# Patient Record
Sex: Male | Born: 1985 | Race: Black or African American | Hispanic: No | Marital: Single | Smoking: Current every day smoker
Health system: Southern US, Community
[De-identification: ages and names within clinical notes are randomized; demographics above are authoritative.]

## PROBLEM LIST (undated history)

## (undated) DIAGNOSIS — I10 Essential (primary) hypertension: Secondary | ICD-10-CM

---
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
## 2008-08-11 ENCOUNTER — Emergency Department (HOSPITAL_COMMUNITY): Admission: EM | Admit: 2008-08-11 | Discharge: 2008-08-12 | Payer: Self-pay | Admitting: Emergency Medicine

## 2012-07-23 ENCOUNTER — Emergency Department (HOSPITAL_COMMUNITY)
Admission: EM | Admit: 2012-07-23 | Discharge: 2012-07-23 | Disposition: A | Discharge status: Home / Self Care | Payer: PRIVATE HEALTH INSURANCE | Attending: Emergency Medicine | Admitting: Emergency Medicine

## 2012-07-23 ENCOUNTER — Encounter (HOSPITAL_COMMUNITY): Payer: Self-pay | Admitting: Emergency Medicine

## 2012-07-23 DIAGNOSIS — J029 Acute pharyngitis, unspecified: Secondary | ICD-10-CM

## 2012-07-23 DIAGNOSIS — F17200 Nicotine dependence, unspecified, uncomplicated: Secondary | ICD-10-CM | POA: Insufficient documentation

## 2012-07-23 LAB — URINALYSIS, ROUTINE W REFLEX MICROSCOPIC
Bilirubin Urine: NEGATIVE
Glucose, UA: NEGATIVE mg/dL
Ketones, ur: NEGATIVE mg/dL
Leukocytes, UA: NEGATIVE
Protein, ur: NEGATIVE mg/dL
pH: 6.5 (ref 5.0–8.0)

## 2012-07-23 LAB — RAPID STREP SCREEN (MED CTR MEBANE ONLY): Streptococcus, Group A Screen (Direct): NEGATIVE

## 2012-07-23 NOTE — ED Provider Notes (Signed)
Medical screening examination/treatment/procedure(s) were performed by non-physician practitioner and as supervising physician I was immediately available for consultation/collaboration.   Candice Lunney L Milik Gilreath, MD 07/23/12 2251 

## 2012-07-23 NOTE — ED Notes (Signed)
Pt alert, arrives from home, c/o sore thraot, onset months ago, states given Amoxicillin, throat felt better, returns this evening with cont discomfort, resp even unlabored, skin pwd, no stridor noted

## 2012-07-23 NOTE — ED Provider Notes (Signed)
History     CSN: [id number]  Arrival date & time 07/23/12  0340   First MD Initiated Contact with Patient 07/23/12 0401      Chief Complaint  Patient presents with  . Sore Throat   HPI  History provided by the patient. Patient is a 26 year old male with no significant PMH who presents with complaints of sore throat for the past 3 days. Patient reports having similar sore throat symptoms to 3 months ago. States she was treated with amoxicillin that time and had improvement. Symptoms began acutely. Patient has used ibuprofen at home for pain with some improvement. He denies having any other associated symptoms. Denies any nasal congestion, rhinorrhea, fever, chills, sweats, nausea or vomiting.    History reviewed. No pertinent past medical history.  History reviewed. No pertinent past surgical history.  No family history on file.  History  Substance Use Topics  . Smoking status: Current Everyday Smoker -- 1.0 packs/day    Types: Cigarettes  . Smokeless tobacco: Not on file  . Alcohol Use: No      Review of Systems  Constitutional: Negative for fever and chills.  HENT: Positive for sore throat. Negative for congestion and rhinorrhea.   Respiratory: Negative for cough.   Gastrointestinal: Negative for nausea and vomiting.  Genitourinary: Positive for flank pain. Negative for dysuria, frequency and hematuria.    Allergies  Review of patient's allergies indicates no known allergies.  Home Medications   Current Outpatient Rx  Name Route Sig Dispense Refill  . IBUPROFEN 200 MG PO TABS Oral Take 600 mg by mouth every 6 (six) hours as needed. Pain      BP 147/86  Pulse 83  Temp 98 F (36.7 C) (Oral)  Resp 16  Wt 175 lb (79.379 kg)  SpO2 99%  Physical Exam  Nursing note and vitals reviewed. Constitutional: He is oriented to person, place, and time. He appears well-developed and well-nourished.  HENT:  Head: Normocephalic.  Mouth/Throat: Oropharynx is clear and  moist.       Mild erythema of pharynx. No exudates. Tonsils normal size. Uvula midline.  Neck: Normal range of motion. Neck supple.  Cardiovascular: Normal rate and regular rhythm.   Pulmonary/Chest: Effort normal and breath sounds normal.  Abdominal: Soft.  Lymphadenopathy:    He has no cervical adenopathy.  Neurological: He is alert and oriented to person, place, and time.  Skin: Skin is warm.  Psychiatric: He has a normal mood and affect.    ED Course  Procedures   Results for orders placed during the hospital encounter of 07/23/12  RAPID STREP SCREEN      Component Value Range   Streptococcus, Group A Screen (Direct) NEGATIVE  NEGATIVE  URINALYSIS, ROUTINE W REFLEX MICROSCOPIC      Component Value Range   Color, Urine YELLOW  YELLOW   APPearance CLEAR  CLEAR   Specific Gravity, Urine 1.010  1.005 - 1.030   pH 6.5  5.0 - 8.0   Glucose, UA NEGATIVE  NEGATIVE mg/dL   Hgb urine dipstick NEGATIVE  NEGATIVE   Bilirubin Urine NEGATIVE  NEGATIVE   Ketones, ur NEGATIVE  NEGATIVE mg/dL   Protein, ur NEGATIVE  NEGATIVE mg/dL   Urobilinogen, UA 0.2  0.0 - 1.0 mg/dL   Nitrite NEGATIVE  NEGATIVE   Leukocytes, UA NEGATIVE  NEGATIVE       1. Pharyngitis       MDM  Patient seen and evaluated. Patient is afebrile. Pharynx mildly erythematous. No  significant swelling of tonsils or exudate. Patient also has some complaints of urinary problems. No penile discharge.   Strep throat negative, UA also unremarkable. This time suspect viral process. We'll discharge home.         Angus Seller, PA 07/23/12 0600

## 2012-07-23 NOTE — ED Notes (Signed)
Pt st's he's had a sore throat for 3 months, st's he took his father's amoxicillin 3 months ago but only took 2 pills.  St's his throat started feeling better at that point so he didn't take anymore.  No runny nose, no n/v/d.  Resp even and unlabored.

## 2015-12-04 ENCOUNTER — Encounter (HOSPITAL_COMMUNITY): Payer: Self-pay | Admitting: Emergency Medicine

## 2015-12-04 ENCOUNTER — Emergency Department (HOSPITAL_COMMUNITY)
Admission: EM | Admit: 2015-12-04 | Discharge: 2015-12-04 | Payer: Self-pay | Attending: Emergency Medicine | Admitting: Emergency Medicine

## 2015-12-04 DIAGNOSIS — E860 Dehydration: Secondary | ICD-10-CM | POA: Insufficient documentation

## 2015-12-04 DIAGNOSIS — F1210 Cannabis abuse, uncomplicated: Secondary | ICD-10-CM | POA: Insufficient documentation

## 2015-12-04 DIAGNOSIS — Z79899 Other long term (current) drug therapy: Secondary | ICD-10-CM | POA: Insufficient documentation

## 2015-12-04 DIAGNOSIS — I10 Essential (primary) hypertension: Secondary | ICD-10-CM | POA: Insufficient documentation

## 2015-12-04 DIAGNOSIS — F1410 Cocaine abuse, uncomplicated: Secondary | ICD-10-CM | POA: Insufficient documentation

## 2015-12-04 DIAGNOSIS — R51 Headache: Secondary | ICD-10-CM | POA: Insufficient documentation

## 2015-12-04 HISTORY — DX: Essential (primary) hypertension: I10

## 2015-12-04 LAB — URINALYSIS, ROUTINE W REFLEX MICROSCOPIC
Glucose, UA: NEGATIVE mg/dL
HGB URINE DIPSTICK: NEGATIVE
Ketones, ur: 15 mg/dL — AB
Nitrite: NEGATIVE
PH: 6 (ref 5.0–8.0)
Protein, ur: 100 mg/dL — AB
SPECIFIC GRAVITY, URINE: 1.035 — AB (ref 1.005–1.030)

## 2015-12-04 LAB — URINE MICROSCOPIC-ADD ON: SQUAMOUS EPITHELIAL / LPF: NONE SEEN

## 2015-12-04 LAB — CBC WITH DIFFERENTIAL/PLATELET
Basophils Absolute: 0 10*3/uL (ref 0.0–0.1)
Basophils Relative: 0 %
EOS PCT: 0 %
Eosinophils Absolute: 0 10*3/uL (ref 0.0–0.7)
HCT: 42.1 % (ref 39.0–52.0)
HEMOGLOBIN: 14.4 g/dL (ref 13.0–17.0)
LYMPHS ABS: 2.4 10*3/uL (ref 0.7–4.0)
LYMPHS PCT: 18 %
MCH: 27.4 pg (ref 26.0–34.0)
MCHC: 34.2 g/dL (ref 30.0–36.0)
MCV: 80 fL (ref 78.0–100.0)
MONOS PCT: 6 %
Monocytes Absolute: 0.8 10*3/uL (ref 0.1–1.0)
Neutro Abs: 10 10*3/uL — ABNORMAL HIGH (ref 1.7–7.7)
Neutrophils Relative %: 76 %
PLATELETS: 255 10*3/uL (ref 150–400)
RBC: 5.26 MIL/uL (ref 4.22–5.81)
RDW: 13.1 % (ref 11.5–15.5)
WBC: 13.1 10*3/uL — AB (ref 4.0–10.5)

## 2015-12-04 LAB — COMPREHENSIVE METABOLIC PANEL
ALK PHOS: 48 U/L (ref 38–126)
ALT: 18 U/L (ref 17–63)
AST: 25 U/L (ref 15–41)
Albumin: 4.9 g/dL (ref 3.5–5.0)
Anion gap: 12 (ref 5–15)
BUN: 20 mg/dL (ref 6–20)
CALCIUM: 9.6 mg/dL (ref 8.9–10.3)
CO2: 24 mmol/L (ref 22–32)
CREATININE: 1.89 mg/dL — AB (ref 0.61–1.24)
Chloride: 100 mmol/L — ABNORMAL LOW (ref 101–111)
GFR, EST AFRICAN AMERICAN: 54 mL/min — AB (ref >60)
GFR, EST NON AFRICAN AMERICAN: 47 mL/min — AB (ref >60)
Glucose, Bld: 143 mg/dL — ABNORMAL HIGH (ref 65–99)
Potassium: 3.7 mmol/L (ref 3.5–5.1)
Sodium: 136 mmol/L (ref 135–145)
Total Bilirubin: 2 mg/dL — ABNORMAL HIGH (ref 0.3–1.2)
Total Protein: 8 g/dL (ref 6.5–8.1)

## 2015-12-04 LAB — RAPID URINE DRUG SCREEN, HOSP PERFORMED
AMPHETAMINES: NOT DETECTED
BENZODIAZEPINES: NOT DETECTED
Barbiturates: NOT DETECTED
Cocaine: POSITIVE — AB
OPIATES: NOT DETECTED
Tetrahydrocannabinol: POSITIVE — AB

## 2015-12-04 MED ORDER — SODIUM CHLORIDE 0.9 % IV BOLUS (SEPSIS)
1000.0000 mL | Freq: Once | INTRAVENOUS | Status: AC
  Administered 2015-12-04: 1000 mL via INTRAVENOUS

## 2015-12-04 NOTE — Discharge Instructions (Signed)
Polysubstance Abuse When people abuse more than one drug or type of drug it is called polysubstance or polydrug abuse. For example, many smokers also drink alcohol. This is one form of polydrug abuse. Polydrug abuse also refers to the use of a drug to counteract an unpleasant effect produced by another drug. It may also be used to help with withdrawal from another drug. People who take stimulants may become agitated. Sometimes this agitation is countered with a tranquilizer. This helps protect against the unpleasant side effects. Polydrug abuse also refers to the use of different drugs at the same time.  Anytime drug use is interfering with normal living activities, it has become abuse. This includes problems with family and friends. Psychological dependence has developed when your mind tells you that the drug is needed. This is usually followed by physical dependence which has developed when continuing increases of drug are required to get the same feeling or "high". This is known as addiction or chemical dependency. A person's risk is much higher if there is a history of chemical dependency in the family. SIGNS OF CHEMICAL DEPENDENCY  You have been told by friends or family that drugs have become a problem.  You fight when using drugs.  You are having blackouts (not remembering what you do while using).  You feel sick from using drugs but continue using.  You lie about use or amounts of drugs (chemicals) used.  You need chemicals to get you going.  You are suffering in work performance or in school because of drug use.  You get sick from use of drugs but continue to use anyway.  You need drugs to relate to people or feel comfortable in social situations.  You use drugs to forget problems. "Yes" answered to any of the above signs of chemical dependency indicates there are problems. The longer the use of drugs continues, the greater the problems will become. If there is a family history of  drug or alcohol use, it is best not to experiment with these drugs. Continual use leads to tolerance. After tolerance develops more of the drug is needed to get the same feeling. This is followed by addiction. With addiction, drugs become the most important part of life. It becomes more important to take drugs than participate in the other usual activities of life. This includes relating to friends and family. Addiction is followed by dependency. Dependency is a condition where drugs are now needed not just to get high, but to feel normal. Addiction cannot be cured but it can be stopped. This often requires outside help and the care of professionals. Treatment centers are listed in the yellow pages under: Cocaine, Narcotics, and Alcoholics Anonymous. Most hospitals and clinics can refer you to a specialized care center. Talk to your caregiver if you need help.   This information is not intended to replace advice given to you by your health care provider. Make sure you discuss any questions you have with your health care provider.   Document Released: 08/07/2005 Document Revised: 03/09/2012 Document Reviewed: 12/21/2014 Elsevier Interactive Patient Education 2016 Elsevier Inc.  Dehydration, Adult Dehydration means your body does not have as much fluid or water as it needs. It happens when you take in less fluid than you lose. Your kidneys, brain, and heart will not work properly without the right amount of fluids.  Dehydration can range from mild to severe. It should be treated right away to help prevent it from becoming severe. HOME CARE  Drink  enough fluid to keep your pee (urine) clear or pale yellow.  Drink water or fluid slowly by taking small sips. You can also try sucking on ice cubes.  Have food or drinks that contain electrolytes. Examples include bananas and sports drinks.  Take over-the-counter and prescription medicines only as told by your doctor.  Prepare oral rehydration solution  (ORS) according to the instructions that came with it. Take sips of ORS every 5 minutes until your pee returns to normal.  If you are throwing up (vomiting) or have watery poop (diarrhea), keep trying to drink water, ORS, or both.  If you have watery poop, avoid:  Drinks with caffeine.  Fruit juice.  Milk.  Carbonated soft drinks.  Do not take salt tablets. This can lead to having too much sodium in your body (hypernatremia). GET HELP IF:  You cannot eat or drink without throwing up.  You have had mild watery poop for longer than 24 hours.  You have a fever. GET HELP RIGHT AWAY IF:   You have very strong thirst.  You have very bad watery poop.  You have not peed in 6-8 hours, or you have peed only a small amount of very dark pee.  You have shriveled skin.  You are dizzy, confused, or both.   This information is not intended to replace advice given to you by your health care provider. Make sure you discuss any questions you have with your health care provider.   Document Released: 10/12/2009 Document Revised: 09/06/2015 Document Reviewed: 05/03/2015 Elsevier Interactive Patient Education 2016 [employer].  Emergency Department Resource Guide 1) Find a Doctor and Pay Out of Pocket Although you won't have to find out who is covered by your insurance plan, it is a good idea to ask around and get recommendations. You will then need to call the office and see if the doctor you have chosen will accept you as a new patient and what types of options they offer for patients who are self-pay. Some doctors offer discounts or will set up payment plans for their patients who do not have insurance, but you will need to ask so you aren't surprised when you get to your appointment.  2) Contact Your Local Health Department Not all health departments have doctors that can see patients for sick visits, but many do, so it is worth a call to see if yours does. If you don't know where your  local health department is, you can check in your phone book. The CDC also has a tool to help you locate your state's health department, and many state websites also have listings of all of their local health departments.  3) Find a Walk-in Clinic If your illness is not likely to be very severe or complicated, you may want to try a walk in clinic. These are popping up all over the country in pharmacies, drugstores, and shopping centers. They're usually staffed by nurse practitioners or physician assistants that have been trained to treat common illnesses and complaints. They're usually fairly quick and inexpensive. However, if you have serious medical issues or chronic medical problems, these are probably not your best option.  No Primary Care Doctor: - Call Health Connect at  [phone] - they can help you locate a primary care doctor that  accepts your insurance, provides certain services, etc. - Physician Referral Service- [phone]  Chronic Pain Problems: Organization         Address  Phone   Notes  [facility]  [phone] Patients need to be referred by their primary care doctor.   Medication Assistance: Organization         Address  Phone   Notes  [facility] Medication [facility] [address] [place], [place] [address] [phone] --Must be a resident of [facility] -- Must have NO insurance coverage whatsoever (no Medicaid/ Medicare, etc.) -- The pt. MUST have a primary care doctor that directs their care regularly and follows them in the community   [facility]  [phone]   [employer]  [phone]    Agencies that provide inexpensive medical care: Organization         Address  Phone   Notes  [facility]  [phone]   [facility]    [phone]   [facility] [address] [place], [place] [address] [phone]   [facility] [address] [place]  [address] [place] [phone]   Planned Parenthood    [phone]   [facility]    [phone]   [facility]  [address], [place] Phone:  [phone], Fax:  [phone] Hours of Operation:  9 am - 6 pm, M-F.  Also accepts Medicaid/Medicare and self-pay.  [facility]  [address], [place] Phone: [phone], Fax: [phone]. Hours of Operation:  8:30 am - 5:30 pm, M-F.  Also accepts Medicaid and self-pay.  [facility] [place] [address], [place] Phone: [phone]   [facility] [address] Natasha Bence [place], [place] [phone], Ext. [phone] Mondays & Thursdays: 7-9 AM.  First 15 patients are seen on a first come, first serve basis.    Medicaid-accepting [facility] Providers:  Organization         Address  Phone   Notes  [facility] [address],  [phone] Also accepts self-pay patients.  [facility] [address] Laurell Josephs [place], [place]  [phone]   [facility] [address], [place] [phone]   [facility] [address] [place] [phone]   Renaye Rakers [address], [place]   [phone] Only accepts [place] Access [place] patients after they have their name applied to their card.   Self-Pay (no insurance) in [facility]:  Organization         Address  Phone   Notes  Sickle Cell Patients, [facility] Internal Medicine [address] [place], [place] [phone]   [facility] [address] [place], [place] [phone]   [facility] [place]  [address], [place] [phone]   [facility]/Dr. Osei-Bonsu  [address] [place] or [address], [place] [phone] Phone number for both [place] and [place] locations is the same.  [facility] [address], [place] [phone]   [facility] [address], [place] or [address] [phone]   [facility] [address], [place] [phone], phone; [phone], fax Sees patients 1st and 3rd Saturday of every month.  Must not qualify for public or private insurance (i.e. Medicaid, Medicare, [place]  Health Choice, Veterans' Benefits)  Household income should be no more than 200% of the poverty level The clinic cannot treat you if you are pregnant or think you are pregnant  Sexually transmitted diseases are not treated at the clinic.    Dental Care: Organization         Address  Phone  Notes  [facility] Department of [facility] [address] [place], [place] [phone] Accepts children up to age 13 who are enrolled in [place] or [place] Health Choice; pregnant women with a Medicaid card; and children who have applied for Medicaid or [place] Health Choice, but were declined, whose parents can pay a reduced fee at time of service.  [facility] Department [facility]  [address], [place] [phone] Accepts children up to age 82 who are enrolled in [place] or [place] Health Choice; pregnant women with a Medicaid card; and children who have applied for Medicaid or [place] Health Choice, but were declined, whose parents can pay a reduced fee at time of service.  [facility]  [address] [place], [place] [phone] Patients are seen by appointment only. Walk-ins are not accepted. [facility] will see patients 16 years of age and older. Monday - Tuesday (8am-5pm) Most Wednesdays (8:30-5pm) $30 per visit, cash only  [facility] Adult Dental Access PROGRAM  [address], [facility] [phone] Patients are seen by appointment only. Walk-ins are not  accepted. [facility] will see patients 3 years of age and older. One Wednesday Evening (Monthly: Volunteer Based).  $30 per visit, cash only  [employer]  [phone] for adults; Children under age 71, call Graduate Pediatric Dentistry at [phone]. Children aged 34-14, please call [phone] to request a pediatric application.  Dental services are provided in all areas of dental care including fillings, crowns and bridges, complete and partial dentures, implants, gum treatment, root canals, and extractions. Preventive care is also provided. Treatment is provided to both adults and children. Patients are selected via a lottery and there is often a waiting list.   [facility] [address] [place]  [phone] [email or web address]   [facility] [address] [place], [place] [phone], Ext. [phone] Second and Fourth Thursday of each month, opens at 6:30 AM; Clinic ends at 9 AM.  Patients are seen on a first-come first-served basis, and a limited number are seen during each clinic.   [facility]  [address] Ether Griffins [place], [place] [phone]   Eligibility Requirements You must have lived in [place], [place], or [place] counties for at least the last three months.   You cannot be eligible for state or federal sponsored [place], including [employer], [place], or [employer].   You generally cannot be eligible for healthcare insurance through your employer.    How to apply: Eligibility screenings are held every Tuesday and Wednesday afternoon from 1:00 pm until 4:00 pm. You do not need an appointment for the interview!  [facility] [address] [place], [place] [phone]   [facility] Health Department  [phone]   [facility] Health Department  [phone]   [facility] Health Department  [phone]    Behavioral Health Resources in the  Community: Intensive Outpatient Programs Organization         Address  Phone  Notes  [facility] [phone]  [address], [place], [place] [phone]   [facility] Outpatient [address], [place], [place] [phone]   [facility] [address] [place], [place]  [phone]   [facility] Mental Health [address],  [place], [place] [phone] or [phone]   Substance Abuse Resources Organization         Address  Phone  Notes  Alcohol [facility] Drug Services  [phone]   [facility]  [phone]   [facility]  [phone]   Floydene Flock  [phone]   Residential & Outpatient Substance Abuse Program  [phone]   Psychological Services Organization         Address  Phone  Notes  [facility] Behavioral Health  [phone]   [facility]  [phone]   [facility] Mental Health [address] [place] [phone] or [phone]    Mobile Crisis Teams Organization         Address  Phone  Notes  [facility], Mobile Crisis Care Unit  [phone]   [facility]  [address] [place], [place] [phone]   Doristine Locks [address] [place] [phone]    Self-Help/Support Groups Organization         Address  Phone             Notes  [facility] [place] - variety of support groups  [phone]- [phone] Call for more information  Narcotics Anonymous (NA), [facility] [address], [employer]  2 meetings at this location   Statistician         Address  Phone  Notes  [facility] [address],    [place]  [phone]   [facility]  [address] [place] [address], [place], [place] [phone]   [facility] Treatment Facility [address] [place], [place] [phone] Admissions: 8am-3pm M-F  [facility] [address]    [place], [place] [phone]   [facility] [address] [place], [place], [place] [phone]   [facility] [address]  [place], [place] [phone]   [facility] - Intensive Outpatient [address] Laurell Josephs [address], [place], [place] [phone]   [facility] ([facility] [address] [place]  [place], [place] [phone] or [phone]   [facility] ([facility]) [address], [place], [place] [phone] Accepts Medicaid  [facility] [address],  [place] [phone] Substance Abuse/Addiction Treatment   [facility] Organization         Address  Phone  Notes  [facility]  [phone]   Angie Fava, PhD [address] Ervin Knack [place], [place]   [phone] or [phone]   [facility] Behavioral   [address] [place], [place] [phone]   [facility] [address] [place], [place] [phone] Insurance/Medicaid/sponsorship through [facility] and Families [address]                                    [place], [place] [phone] Therapy/tele-psych/case  [facility] [address], [place] [phone]    Dr. Lolly Mustache  [phone]   [facility]  [employer] [facility] Dept. 1) [address]  [place] 2) [place] 3)  [place] [address], [place] [phone]  [phone]   [place] [phone] or [phone] (After Hours)

## 2015-12-04 NOTE — ED Provider Notes (Signed)
CSN: [id number]     Arrival date & time 12/04/15  1651 History   First MD Initiated Contact with Patient 12/04/15 1839     Chief Complaint  Patient presents with  . Aggressive Behavior   (Consider location/radiation/quality/duration/timing/severity/associated sxs/prior Treatment) The history is provided by the patient and the police. No language interpreter was used.    Mr. Albrecht is a 29 y.o male with a past medical history of hypertension who presents via EMS in [employer] custody for walking around naked in the street. He would not speak to me with [employer] in the room. After they left, he admitted to doing marijuana but states that his head feels heavy. He is not very cooperative with answering questions. He nods yes and no to everything. He denies being any pain now. He denies any recent illness. Denies any SI, HI, or hallucinations.   Past Medical History  Diagnosis Date  . Hypertension    History reviewed. No pertinent past surgical history. History reviewed. No pertinent family history. Social History  Substance Use Topics  . Smoking status: None  . Smokeless tobacco: None  . Alcohol Use: Yes    Review of Systems  Constitutional: Negative for fever and chills.  Respiratory: Negative for shortness of breath.   Cardiovascular: Negative for chest pain.  Gastrointestinal: Negative for abdominal pain.  Neurological: Positive for headaches.  All other systems reviewed and are negative.     Allergies  Review of patient's allergies indicates no known allergies.  Home Medications   Prior to Admission medications   Medication Sig Start Date End Date Taking? Authorizing Provider  lisinopril (PRINIVIL,ZESTRIL) 5 MG tablet Take 5 mg by mouth daily.   Yes Historical Provider, MD   BP 118/76 mmHg  Pulse 76  Temp(Src) 99 F (37.2 C) (Oral)  Resp 16  SpO2 97% Physical Exam  Constitutional: He is oriented to person, place, and time. He appears well-developed and well-nourished.   HENT:  Head: Normocephalic and atraumatic.  Eyes: Conjunctivae are normal.  Neck: Normal range of motion. Neck supple.  Cardiovascular: Normal rate, regular rhythm and normal heart sounds.   No murmur heard. Regular rate and rhythm. No murmur.  Pulmonary/Chest: Effort normal and breath sounds normal. No respiratory distress. He has no wheezes. He has no rales.  No decreased breath sounds. Lungs clear to auscultation bilaterally.  Abdominal: Soft. There is no tenderness.  No abdominal tenderness to palpation. No distention. No guarding or rebound.  Musculoskeletal: Normal range of motion.  Right arm hand cuffed to seat.   Neurological: He is alert and oriented to person, place, and time.  Skin: Skin is warm and dry.  Psychiatric: He has a normal mood and affect.  No hallucinations or suicidal thoughts. No homicidal thoughts.   Nursing note and vitals reviewed.   ED Course  Procedures (including critical care time) Labs Review Labs Reviewed  CBC WITH DIFFERENTIAL/PLATELET - Abnormal; Notable for the following:    WBC 13.1 (*)    Neutro Abs 10.0 (*)    All other components within normal limits  COMPREHENSIVE METABOLIC PANEL - Abnormal; Notable for the following:    Chloride 100 (*)    Glucose, Bld 143 (*)    Creatinine, Ser 1.89 (*)    Total Bilirubin 2.0 (*)    GFR calc non Af Amer 47 (*)    GFR calc Af Amer 54 (*)    All other components within normal limits  URINE RAPID DRUG SCREEN, HOSP PERFORMED - Abnormal;  Notable for the following:    Cocaine POSITIVE (*)    Tetrahydrocannabinol POSITIVE (*)    All other components within normal limits  URINALYSIS, ROUTINE W REFLEX MICROSCOPIC (NOT AT [facility]) - Abnormal; Notable for the following:    Color, Urine ORANGE (*)    APPearance CLOUDY (*)    Specific Gravity, Urine 1.035 (*)    Bilirubin Urine SMALL (*)    Ketones, ur 15 (*)    Protein, ur 100 (*)    Leukocytes, UA TRACE (*)    All other components within normal limits   URINE MICROSCOPIC-ADD ON - Abnormal; Notable for the following:    Bacteria, UA FEW (*)    Casts HYALINE CASTS (*)    All other components within normal limits    Imaging Review No results found. I have personally reviewed and evaluated these lab results as part of my medical decision-making.   EKG Interpretation None      MDM   Final diagnoses:  Dehydration  Cocaine abuse   Patient presents for evaluation after possible drug use and aggressive behavior toward [employer]. He is closing his eyes for long periods of time during questioning. He nods his head up and down and side to side with all questioning. He is tachycardic at 135 but otherwise is well-appearing. He has no complaints of pain. Due to somnolence, we will order basic labs and drug screen. I'm not sure if he is putting on an active versus the effects of drugs. He has no SI, HI, or hallucinations.    If all findings are normal, patient can be discharged in police custody. Patient has high specific gravity and creatinine is 1.9. I believe this is due to dehydration. He is also positive for cocaine and THC. Patient was given 2 L of fluid. Otherwise his labs are not concerning. Recheck: Patient is alert and sitting up after fluids. He is well appearing and has no pain.  He was discharged in custody of [employer].      Catha GosselinHanna Patel-Mills, PA-C 12/04/15 2300  Lyndal Pulleyaniel Knott, MD 12/05/15 [phone]
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
## 2015-12-04 NOTE — ED Notes (Signed)
Pt gave verbal understanding of d/c instructions. Pt d/c in police custody ambulatory with steady gait

## 2015-12-05 ENCOUNTER — Encounter (HOSPITAL_COMMUNITY): Payer: Self-pay | Admitting: Emergency Medicine

## 2016-02-01 ENCOUNTER — Encounter (HOSPITAL_BASED_OUTPATIENT_CLINIC_OR_DEPARTMENT_OTHER): Payer: Self-pay | Admitting: Emergency Medicine

## 2016-02-01 ENCOUNTER — Emergency Department (HOSPITAL_BASED_OUTPATIENT_CLINIC_OR_DEPARTMENT_OTHER): Payer: PRIVATE HEALTH INSURANCE

## 2016-02-01 ENCOUNTER — Emergency Department (HOSPITAL_BASED_OUTPATIENT_CLINIC_OR_DEPARTMENT_OTHER)
Admission: EM | Admit: 2016-02-01 | Discharge: 2016-02-02 | Disposition: A | Discharge status: Home / Self Care | Payer: PRIVATE HEALTH INSURANCE | Attending: Emergency Medicine | Admitting: Emergency Medicine

## 2016-02-01 DIAGNOSIS — Z79899 Other long term (current) drug therapy: Secondary | ICD-10-CM | POA: Insufficient documentation

## 2016-02-01 DIAGNOSIS — I10 Essential (primary) hypertension: Secondary | ICD-10-CM | POA: Insufficient documentation

## 2016-02-01 DIAGNOSIS — J029 Acute pharyngitis, unspecified: Secondary | ICD-10-CM | POA: Insufficient documentation

## 2016-02-01 DIAGNOSIS — F17200 Nicotine dependence, unspecified, uncomplicated: Secondary | ICD-10-CM | POA: Insufficient documentation

## 2016-02-01 LAB — RAPID STREP SCREEN (MED CTR MEBANE ONLY): Streptococcus, Group A Screen (Direct): NEGATIVE

## 2016-02-01 MED ORDER — DIPHENHYDRAMINE HCL 50 MG/ML IJ SOLN
25.0000 mg | Freq: Once | INTRAMUSCULAR | Status: AC
  Administered 2016-02-01: 25 mg via INTRAVENOUS
  Filled 2016-02-01: qty 1

## 2016-02-01 MED ORDER — FAMOTIDINE IN NACL 20-0.9 MG/50ML-% IV SOLN
20.0000 mg | Freq: Once | INTRAVENOUS | Status: AC
  Administered 2016-02-01: 20 mg via INTRAVENOUS
  Filled 2016-02-01: qty 50

## 2016-02-01 MED ORDER — METHYLPREDNISOLONE SODIUM SUCC 125 MG IJ SOLR
125.0000 mg | Freq: Once | INTRAMUSCULAR | Status: AC
  Administered 2016-02-01: 125 mg via INTRAVENOUS
  Filled 2016-02-01: qty 2

## 2016-02-01 NOTE — ED Notes (Signed)
Return from xray

## 2016-02-01 NOTE — ED Notes (Signed)
Patient reports that he has pain to his throat after he ate some peanut butter since this am at about 9 am. The patient also reports that "my lungs are hurting and I cant catch my breath"

## 2016-02-01 NOTE — ED Provider Notes (Signed)
CSN: [id number]     Arrival date & time 02/01/16  2017 History   First MD Initiated Contact with Patient 02/01/16 2043     Chief Complaint  Patient presents with  . Shortness of Breath   HPI  [name] Ramirez is a 30 -year-old male with a past medical history of hypertension presenting with throat pain and shortness of breath. He states that he ate some peanut butter this morning and within that hour had sensation of throat itching. He denies a known history of allergies to peanut butter and states that he eats it weekly. After work, he states that his throat itching worsened and he had a sensation of throat closing. He reports associated shortness of breath with this. He states that it was difficult to catch his breath and he called his father stating that he could not breathe. He denies associated chest pain, dizziness, syncope, diaphoresis, nausea or vomiting. He states that this sensation of shortness of breath resolved shortly after talking with his dad who calmed him down. Currently he is only experiencing throat itching sensation. He also endorses increased throat pain when swallowing. He states that he no longer feels that his throat was closing. He is able to handle secretions. He has not taken any medications prior to arrival. Denies fevers, chills, headaches, dizziness, syncope, upper respiratory infection symptoms, chest pain, cough, and all pain, nausea, vomiting, eyelid swelling, lip swelling, tongue swelling, rash or any other symptoms. He does note that he is supposed to take blood pressure medicine but he has not taken this in over a month.  Past Medical History  Diagnosis Date  . Hypertension    History reviewed. No pertinent past surgical history. History reviewed. No pertinent family history. Social History  Substance Use Topics  . Smoking status: Current Every Day Smoker  . Smokeless tobacco: None  . Alcohol Use: Yes     Comment: occ    Review of Systems  HENT: Positive for  sore throat.   Respiratory: Positive for shortness of breath.   All other systems reviewed and are negative.     Allergies  Review of patient's allergies indicates no known allergies.  Home Medications   Prior to Admission medications   Medication Sig Start Date End Date Taking? Authorizing Provider  lisinopril (PRINIVIL,ZESTRIL) 5 MG tablet Take 5 mg by mouth daily.   Yes Historical Provider, MD  ibuprofen (ADVIL,MOTRIN) 200 MG tablet Take 600 mg by mouth every 6 (six) hours as needed. Pain    Historical Provider, MD   BP 145/92 mmHg  Pulse 70  Temp(Src) 98.7 F (37.1 C) (Oral)  Resp 16  Ht  (1.753 m)  Wt 99.791 kg  BMI 32.47 kg/m2  SpO2 100% Physical Exam  Constitutional: He appears well-developed and well-nourished. No distress.  HENT:  Head: Normocephalic and atraumatic.  Mouth/Throat: Uvula is midline. Mucous membranes are not dry. No trismus in the jaw. No uvula swelling. Posterior oropharyngeal erythema present. No oropharyngeal exudate or posterior oropharyngeal edema.  No eyelid, lip or tongue edema. Pt handling secretions well.   Eyes: Conjunctivae are normal. Right eye exhibits no discharge. Left eye exhibits no discharge. No scleral icterus.  Neck: Normal range of motion. Neck supple.  No stridor. FROM of neck intact.   Cardiovascular: Normal rate, regular rhythm and normal heart sounds.   Pulmonary/Chest: Effort normal and breath sounds normal. No stridor. No respiratory distress. He has no wheezes. He has no rales.  Breathing unlabored. Lungs CTAB  Abdominal: Soft. He exhibits no distension. There is no tenderness.  Musculoskeletal: Normal range of motion.  Moves all extremities spontaneously  Lymphadenopathy:    He has cervical adenopathy.  Neurological: He is alert. Coordination normal.  Skin: Skin is warm and dry. No rash noted.  No urticaria   Psychiatric: He has a normal mood and affect. His behavior is normal.  Nursing note and vitals  reviewed.   ED Course  Procedures (including critical care time) Labs Review Labs Reviewed  RAPID STREP SCREEN (NOT AT [facility])  CULTURE, GROUP A STREP [facility])    Imaging Review Dg Chest 2 View  02/01/2016  CLINICAL DATA:  Shortness of breath. EXAM: CHEST  2 VIEW COMPARISON:  None. FINDINGS: Normal heart size and mediastinal contours. No acute infiltrate or edema. No effusion or pneumothorax. No acute osseous findings. IMPRESSION: Negative chest. Electronically Signed   By: Marnee Spring M.D.   On: 02/01/2016 22:37   I have personally reviewed and evaluated these images and lab results as part of my medical decision-making.   EKG Interpretation None      MDM   Final diagnoses:  Viral pharyngitis   Pt presenting with sore throat and odynophagia. Patient also endorses sensation of throat closing with shortness of breath earlier today which has resolved. He was concerned he has had an allergic reaction to peanut butter he ate this morning. VSS. Oropharyngeal erythema without exudate. No uvular edema or deviation. No trismus. Cervical lymphadenopathy present. No stridor. Lungs clear to auscultation bilaterally. Treated with methylprednisolone, Benadryl and Pepcid in the emergency department for possible allergic reaction which he reports mildly improved his sore throat. Negative strep. Likely viral pharyngitis. No indication that this is an allergic reaction. No rashes, facial swelling, stridor or wheezing. No abx indicated. DC w symptomatic tx for pain to include OTC pain relievers (tylenol, motrin) and chloraseptic spray.  Pt does not appear dehydrated, but did discuss importance of water rehydration. Presentation not concerning for PTA or infxn spread to soft tissues of the neck. Pt able to tolerate PO intake in the ED. Pt is to follow up with his PCP. Return precautions given in discharge paperwork and discussed with pt at bedside. Pt stable for discharge      Alveta Heimlich,  PA-C 02/02/16 0011  Melene Plan, DO 02/05/16 [phone]

## 2016-02-01 NOTE — ED Notes (Signed)
Patient transported to X-ray 

## 2016-02-01 NOTE — Discharge Instructions (Signed)

## 2016-02-01 NOTE — ED Notes (Signed)
Apple juice given for PO challenge.  

## 2016-02-01 NOTE — ED Notes (Signed)
Nurse first- O2 sat 100%- HR 89- RR 20-NAD

## 2016-02-04 LAB — CULTURE, GROUP A STREP (THRC)

## 2016-12-26 IMAGING — CR DG CHEST 2V
2 series · 2 of 2 positions shown · non-contrast
Comparison: None.

CLINICAL DATA: Shortness of breath.

EXAM:
CHEST  2 VIEW

[w chest pa]
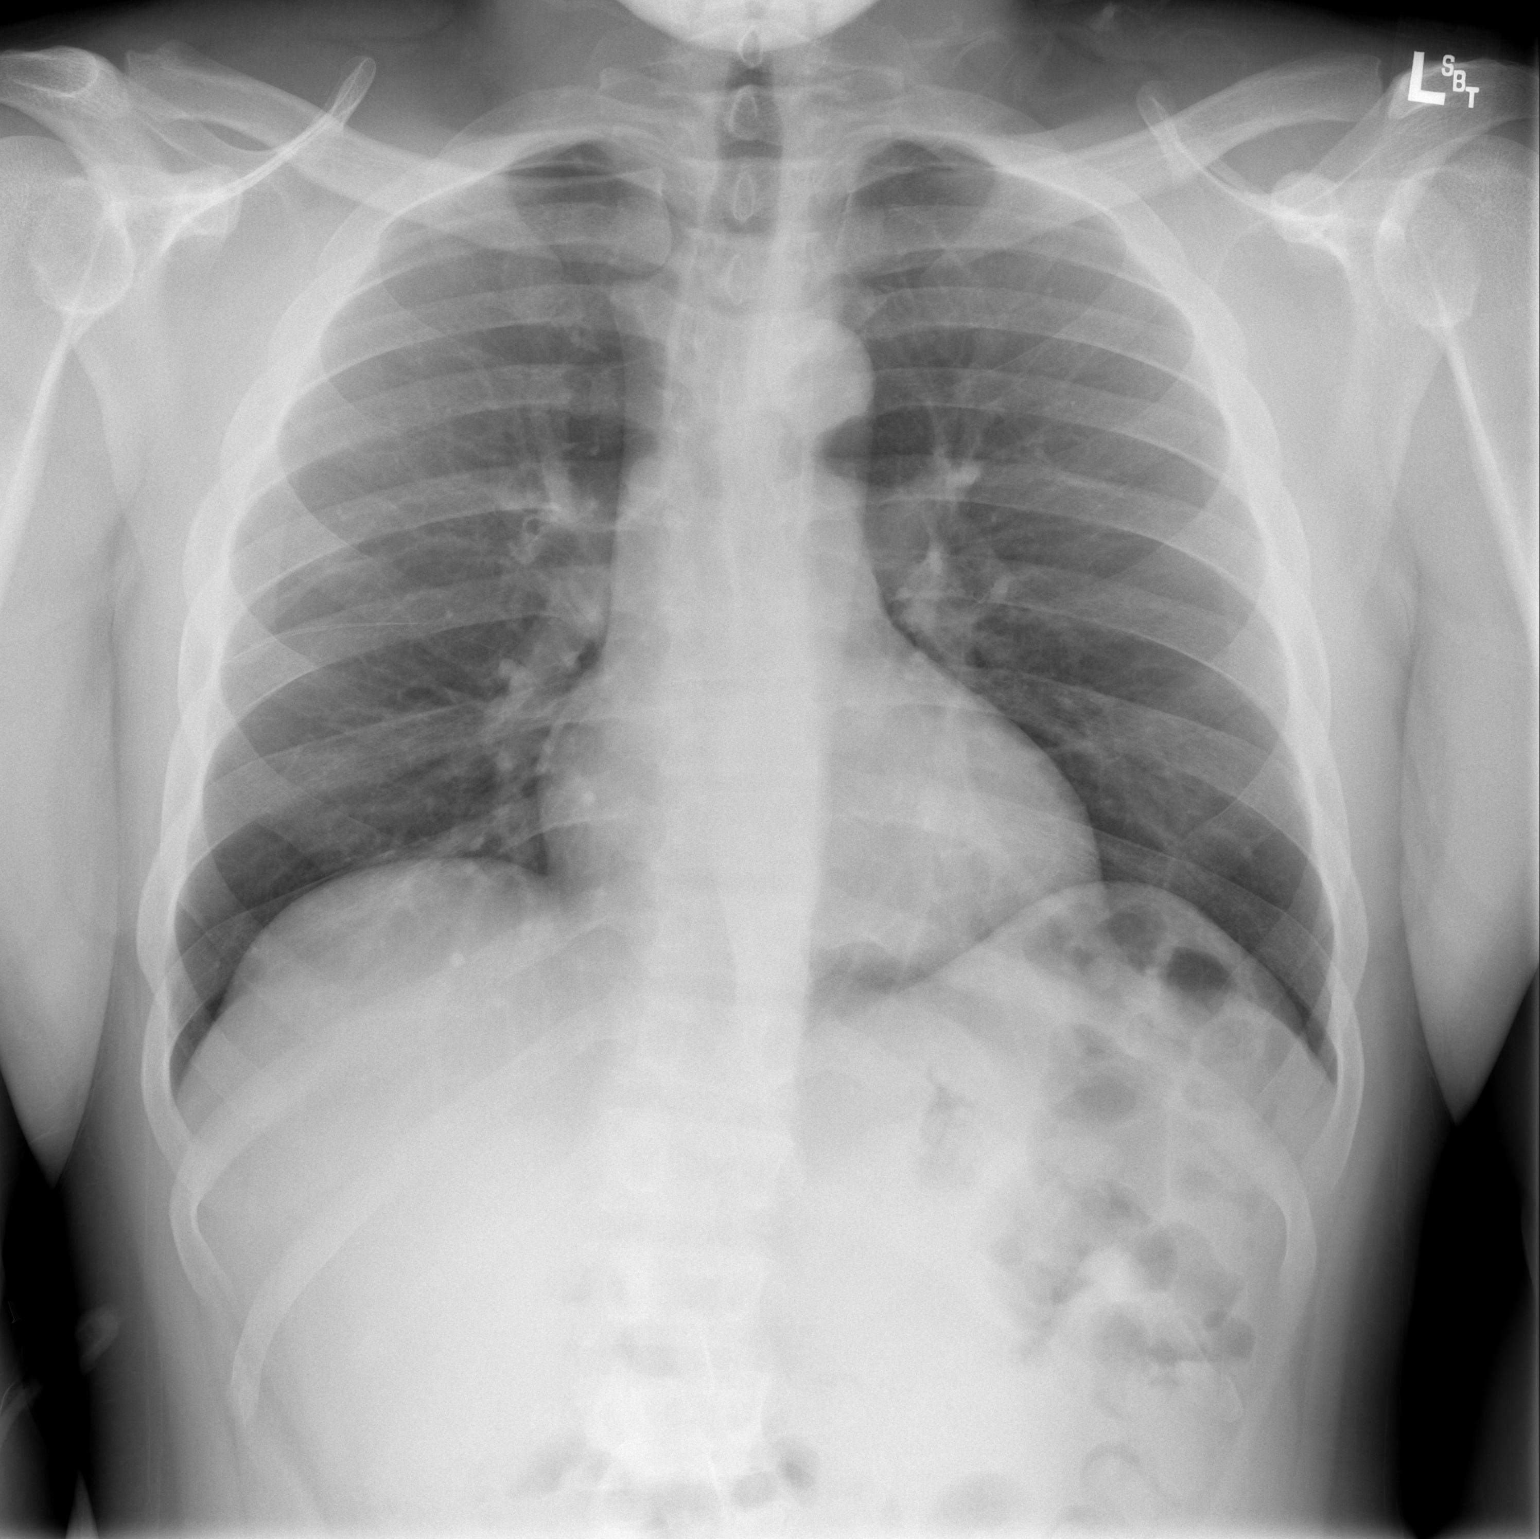

[w chest lat]
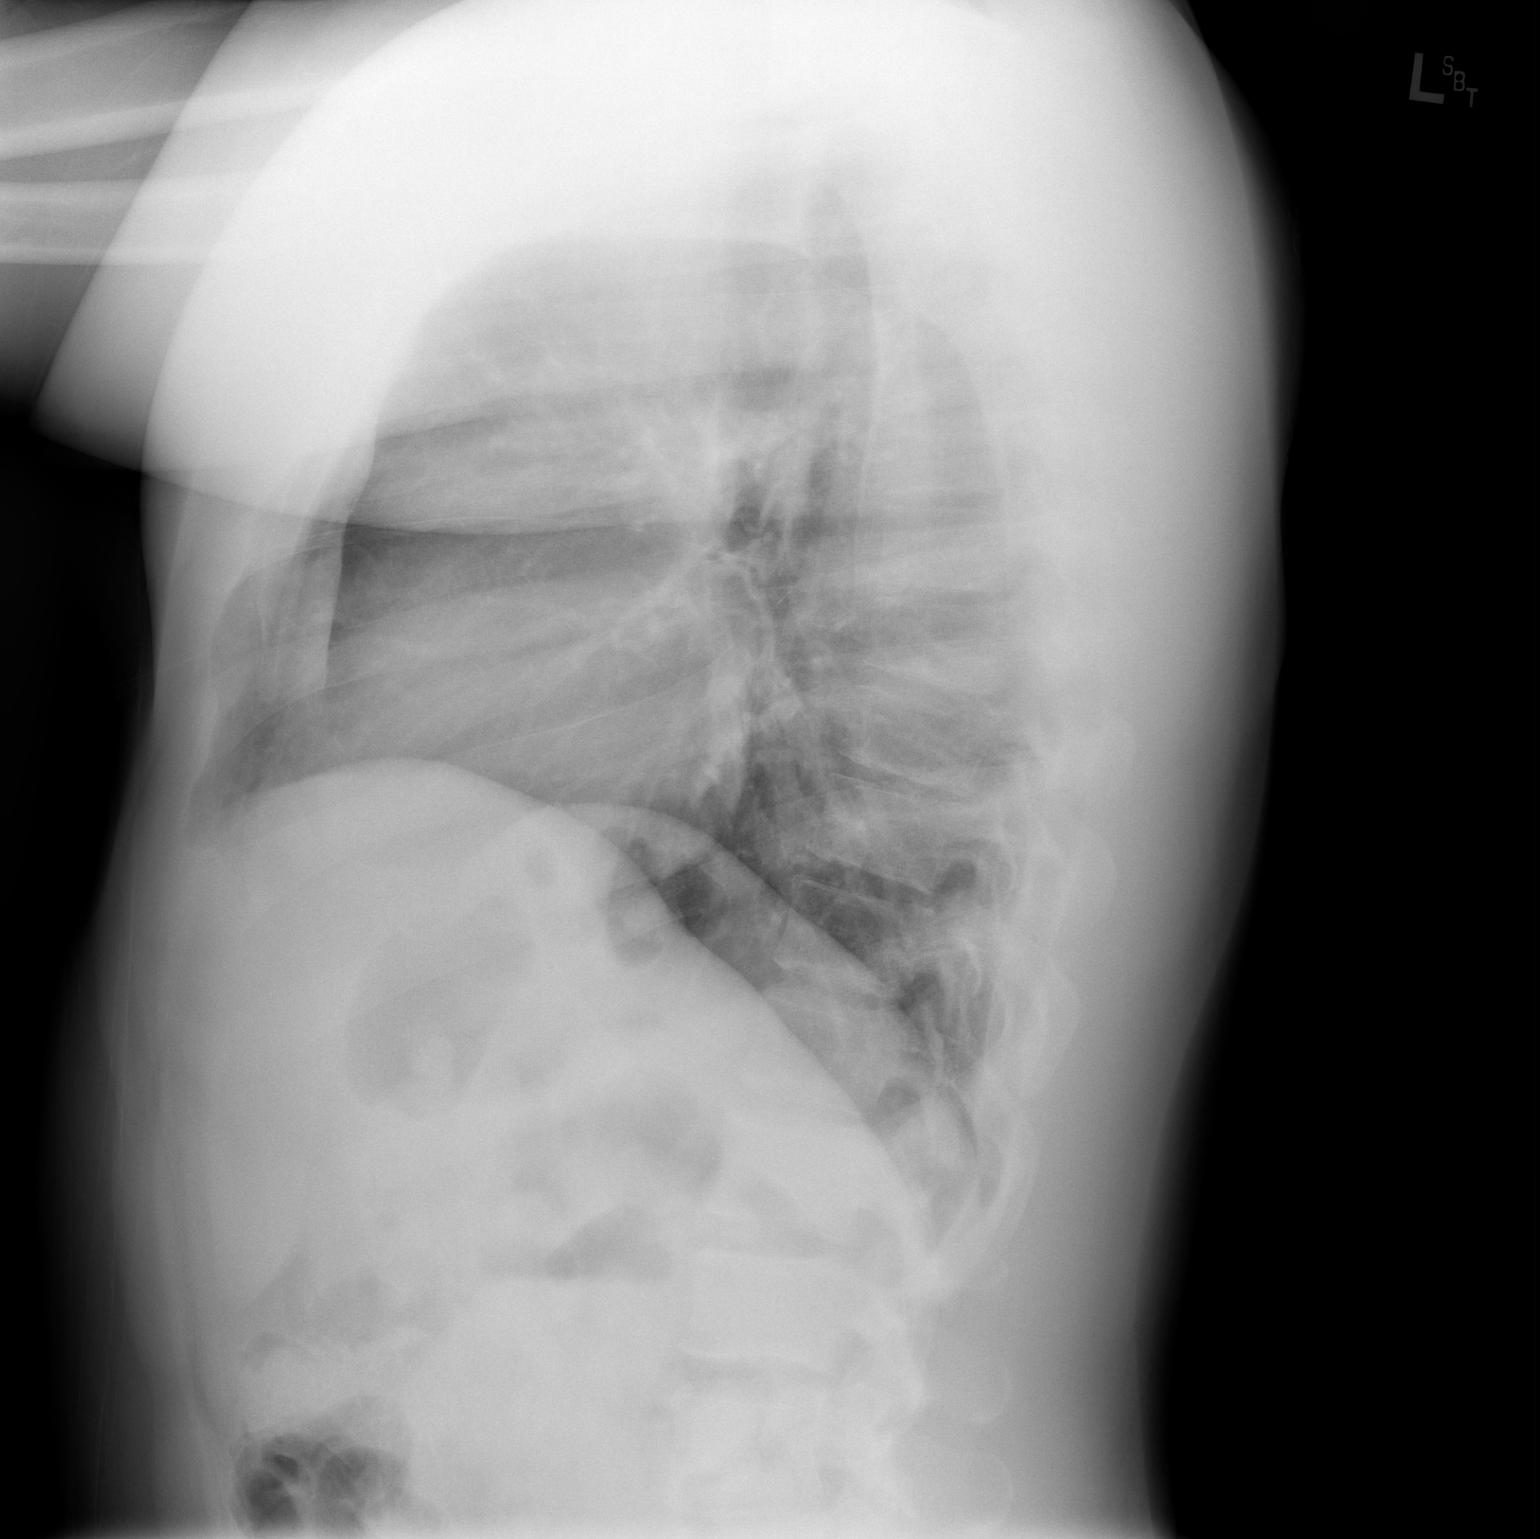

[2 of 2 positions shown; findings below may reference images not displayed]

FINDINGS: Normal heart size and mediastinal contours. No acute infiltrate or
edema. No effusion or pneumothorax. No acute osseous findings.
IMPRESSION: Negative chest.

## 2018-09-08 ENCOUNTER — Encounter (HOSPITAL_BASED_OUTPATIENT_CLINIC_OR_DEPARTMENT_OTHER): Payer: Self-pay

## 2018-09-08 ENCOUNTER — Inpatient Hospital Stay (HOSPITAL_BASED_OUTPATIENT_CLINIC_OR_DEPARTMENT_OTHER)
Admission: EM | Admit: 2018-09-08 | Discharge: 2018-09-15 | DRG: 558 | Disposition: A | Discharge status: Home / Self Care | Payer: Self-pay | Attending: Internal Medicine | Admitting: Internal Medicine

## 2018-09-08 ENCOUNTER — Other Ambulatory Visit: Payer: Self-pay

## 2018-09-08 DIAGNOSIS — R17 Unspecified jaundice: Secondary | ICD-10-CM | POA: Diagnosis present

## 2018-09-08 DIAGNOSIS — R7989 Other specified abnormal findings of blood chemistry: Secondary | ICD-10-CM

## 2018-09-08 DIAGNOSIS — R945 Abnormal results of liver function studies: Secondary | ICD-10-CM

## 2018-09-08 DIAGNOSIS — R710 Precipitous drop in hematocrit: Secondary | ICD-10-CM | POA: Diagnosis present

## 2018-09-08 DIAGNOSIS — E876 Hypokalemia: Secondary | ICD-10-CM | POA: Diagnosis present

## 2018-09-08 DIAGNOSIS — R001 Bradycardia, unspecified: Secondary | ICD-10-CM | POA: Diagnosis present

## 2018-09-08 DIAGNOSIS — M6282 Rhabdomyolysis: Principal | ICD-10-CM | POA: Diagnosis present

## 2018-09-08 DIAGNOSIS — E162 Hypoglycemia, unspecified: Secondary | ICD-10-CM | POA: Diagnosis present

## 2018-09-08 DIAGNOSIS — F1290 Cannabis use, unspecified, uncomplicated: Secondary | ICD-10-CM | POA: Diagnosis present

## 2018-09-08 DIAGNOSIS — R740 Nonspecific elevation of levels of transaminase and lactic acid dehydrogenase [LDH]: Secondary | ICD-10-CM | POA: Diagnosis present

## 2018-09-08 DIAGNOSIS — R000 Tachycardia, unspecified: Secondary | ICD-10-CM | POA: Diagnosis present

## 2018-09-08 DIAGNOSIS — E869 Volume depletion, unspecified: Secondary | ICD-10-CM | POA: Diagnosis present

## 2018-09-08 DIAGNOSIS — F1210 Cannabis abuse, uncomplicated: Secondary | ICD-10-CM | POA: Diagnosis present

## 2018-09-08 DIAGNOSIS — D72829 Elevated white blood cell count, unspecified: Secondary | ICD-10-CM | POA: Diagnosis present

## 2018-09-08 DIAGNOSIS — F1410 Cocaine abuse, uncomplicated: Secondary | ICD-10-CM | POA: Diagnosis present

## 2018-09-08 DIAGNOSIS — Z886 Allergy status to analgesic agent status: Secondary | ICD-10-CM

## 2018-09-08 DIAGNOSIS — I10 Essential (primary) hypertension: Secondary | ICD-10-CM | POA: Diagnosis present

## 2018-09-08 LAB — COMPREHENSIVE METABOLIC PANEL
ALBUMIN: 3.1 g/dL — AB (ref 3.5–5.0)
ALT: 35 U/L (ref 0–44)
AST: 140 U/L — AB (ref 15–41)
Alkaline Phosphatase: 29 U/L — ABNORMAL LOW (ref 38–126)
Anion gap: 10 (ref 5–15)
BILIRUBIN TOTAL: 1.1 mg/dL (ref 0.3–1.2)
BUN: 11 mg/dL (ref 6–20)
CO2: 16 mmol/L — ABNORMAL LOW (ref 22–32)
Calcium: 6.6 mg/dL — ABNORMAL LOW (ref 8.9–10.3)
Chloride: 116 mmol/L — ABNORMAL HIGH (ref 98–111)
Creatinine, Ser: 0.86 mg/dL (ref 0.61–1.24)
GFR calc Af Amer: >60 mL/min (ref >60)
GFR calc non Af Amer: >60 mL/min (ref >60)
GLUCOSE: 68 mg/dL — AB (ref 70–99)
POTASSIUM: 2.8 mmol/L — AB (ref 3.5–5.1)
Sodium: 142 mmol/L (ref 135–145)
TOTAL PROTEIN: 5.3 g/dL — AB (ref 6.5–8.1)

## 2018-09-08 LAB — URINALYSIS, ROUTINE W REFLEX MICROSCOPIC
BILIRUBIN URINE: NEGATIVE
GLUCOSE, UA: 250 mg/dL — AB
KETONES UR: 40 mg/dL — AB
Leukocytes, UA: NEGATIVE
Nitrite: NEGATIVE
PH: 6 (ref 5.0–8.0)
Protein, ur: NEGATIVE mg/dL
Specific Gravity, Urine: 1.01 (ref 1.005–1.030)

## 2018-09-08 LAB — CBC
HEMATOCRIT: 46.3 % (ref 39.0–52.0)
Hemoglobin: 16.7 g/dL (ref 13.0–17.0)
MCH: 27.3 pg (ref 26.0–34.0)
MCHC: 36.1 g/dL — AB (ref 30.0–36.0)
MCV: 75.8 fL — AB (ref 78.0–100.0)
Platelets: 290 10*3/uL (ref 150–400)
RBC: 6.11 MIL/uL — ABNORMAL HIGH (ref 4.22–5.81)
RDW: 13.6 % (ref 11.5–15.5)
WBC: 11.3 10*3/uL — ABNORMAL HIGH (ref 4.0–10.5)

## 2018-09-08 LAB — URINALYSIS, MICROSCOPIC (REFLEX)

## 2018-09-08 LAB — MAGNESIUM: Magnesium: 1.2 mg/dL — ABNORMAL LOW (ref 1.7–2.4)

## 2018-09-08 LAB — CBG MONITORING, ED: Glucose-Capillary: 193 mg/dL — ABNORMAL HIGH (ref 70–99)

## 2018-09-08 LAB — CK: Total CK: 6774 U/L — ABNORMAL HIGH (ref 49–397)

## 2018-09-08 LAB — PHOSPHORUS: Phosphorus: 1.4 mg/dL — ABNORMAL LOW (ref 2.5–4.6)

## 2018-09-08 LAB — LIPASE, BLOOD: Lipase: 21 U/L (ref 11–51)

## 2018-09-08 MED ORDER — DEXTROSE 50 % IV SOLN
1.0000 | Freq: Once | INTRAVENOUS | Status: AC
  Administered 2018-09-08: 50 mL via INTRAVENOUS
  Filled 2018-09-08: qty 50

## 2018-09-08 MED ORDER — POTASSIUM CHLORIDE 10 MEQ/100ML IV SOLN
10.0000 meq | Freq: Once | INTRAVENOUS | Status: AC
  Administered 2018-09-08: 10 meq via INTRAVENOUS
  Filled 2018-09-08: qty 100

## 2018-09-08 MED ORDER — K PHOS MONO-SOD PHOS DI & MONO 155-852-130 MG PO TABS
500.0000 mg | ORAL_TABLET | Freq: Two times a day (BID) | ORAL | Status: DC
  Administered 2018-09-09 – 2018-09-15 (×13): 500 mg via ORAL
  Filled 2018-09-08 (×15): qty 2

## 2018-09-08 MED ORDER — POTASSIUM CHLORIDE 10 MEQ/100ML IV SOLN
10.0000 meq | INTRAVENOUS | Status: AC
  Administered 2018-09-09 (×3): 10 meq via INTRAVENOUS
  Filled 2018-09-08 (×2): qty 100

## 2018-09-08 MED ORDER — SODIUM CHLORIDE 0.9 % IV SOLN
INTRAVENOUS | Status: DC
  Administered 2018-09-08 – 2018-09-10 (×4): via INTRAVENOUS

## 2018-09-08 MED ORDER — LORAZEPAM 2 MG/ML IJ SOLN
1.0000 mg | Freq: Once | INTRAMUSCULAR | Status: AC
  Administered 2018-09-08: 1 mg via INTRAVENOUS
  Filled 2018-09-08: qty 1

## 2018-09-08 MED ORDER — LACTATED RINGERS IV BOLUS
2000.0000 mL | Freq: Once | INTRAVENOUS | Status: AC
Start: 2018-09-08 — End: 2018-09-08
  Administered 2018-09-08: 2000 mL via INTRAVENOUS

## 2018-09-08 MED ORDER — MAGNESIUM SULFATE 2 GM/50ML IV SOLN
2.0000 g | Freq: Once | INTRAVENOUS | Status: AC
  Administered 2018-09-09: 2 g via INTRAVENOUS
  Filled 2018-09-08: qty 50

## 2018-09-08 NOTE — Plan of Care (Signed)
Transfer from [facility] [name] Ramirez is a 32 y/o M with pmh of HTN and polysubstance abuse; who presents with generalized body aches and cramps.  Labs revealed WBC 11.3, potassium 2.8, chloride 116, CO2 16, creatinine 0.86, glucose 68, magnesium 1.2 , phosphorus 1.4, AST 140, ALT 35, and CK 6774.  Urinalysis was significant for ketones and trace hemoglobin, but no signs of infection.  Patient was treated with 2 L of lactated Ringer's, amp of D50, 10 mEq of potassium chloride, and 1 mg of Ativan.  TRH called to admit.  Accept to a telemetry bed.

## 2018-09-08 NOTE — ED Notes (Signed)
ED Provider at bedside. 

## 2018-09-08 NOTE — ED Provider Notes (Signed)
[facility] EMERGENCY DEPARTMENT Provider Note  CSN: [id number] Arrival date & time: 09/08/18  2012    History   Chief Complaint Chief Complaint  Patient presents with  . Generalized Body Aches    Cramps  . Hyperventilating    HPI [name] Ramirez is a 32 y.o. male with a medical history of HTN who presented to the ED for muscle cramps x4 days. He describes whole body myalgias with intermittent, severe cramping that began acutely on Saturday 09/05/18 after work. He states that the cramping occurs in different parts of his body and states it feels like "a giant Charlie horse." The cramping is so severe that it causes him to hyperventilate. Denies any recent trauma, crush injuries, new medications, recent illness or substance use. Patient states that he works for a [employer] and was working all day. Denies fever, chills, upper respiratory complaints, chest pain, SOB, cough, abdominal pain, N/V/D. Patient has tried nothing for treatment prior to coming to the ED.   Past Medical History:  Diagnosis Date  . Hypertension     There are no active problems to display for this patient.   History reviewed. No pertinent surgical history.      Home Medications    Prior to Admission medications   Medication Sig Start Date End Date Taking? Authorizing Provider  ibuprofen (ADVIL,MOTRIN) 200 MG tablet Take 600 mg by mouth every 6 (six) hours as needed. Pain    [provider]  lisinopril (PRINIVIL,ZESTRIL) 5 MG tablet Take 5 mg by mouth daily.    [provider]    Family History History reviewed. No pertinent family history.  Social History Social History   Tobacco Use  . Smoking status: Current Every Day Smoker  Substance Use Topics  . Alcohol use: Yes    Comment: occ  . Drug use: Yes    Types: Marijuana     Allergies   Patient has no known allergies.   Review of Systems Review of Systems  Constitutional: Negative for chills and fever.    HENT: Negative.   Eyes: Negative.   Respiratory: Negative for cough, chest tightness and shortness of breath.   Cardiovascular: Negative for chest pain and palpitations.  Gastrointestinal: Negative for abdominal pain, diarrhea, nausea and vomiting.  Endocrine: Negative.   Genitourinary: Negative.   Musculoskeletal: Positive for myalgias. Negative for arthralgias and joint swelling.  Skin: Negative for color change, pallor and rash.  Neurological: Negative.    Physical Exam Updated Vital Signs BP 115/88   Pulse 72   Temp (!) 97.4 F (36.3 C)   Resp (!) 25   Ht 5\' 7"  (1.702 m)   Wt 107.5 kg   SpO2 100%   BMI 37.12 kg/m   Physical Exam  Constitutional: He appears well-developed and well-nourished.  Moving in bed uncomfortably due to spasm  Eyes: Pupils are equal, round, and reactive to light. Conjunctivae and EOM are normal.  Neck: Normal range of motion. Neck supple.  Cardiovascular: Normal rate, regular rhythm and normal heart sounds.  Pulmonary/Chest: Effort normal and breath sounds normal. No respiratory distress.  Abdominal: Soft. Bowel sounds are normal. There is no tenderness.  Musculoskeletal:  Full ROM in upper and lower extremities with 5/5 strength. No bony tenderness or muscular tenderness to palpation. Endorses multiple spasms in right hip, low back and obliques during interview.  Neurological: He has normal strength. No sensory deficit. He exhibits normal muscle tone.  Skin: Skin is warm and intact. Capillary refill takes  less than 2 seconds. No abrasion and no bruising noted.  Nursing note and vitals reviewed.  ED Treatments / Results  Labs (all labs ordered are listed, but only abnormal results are displayed) Labs Reviewed  COMPREHENSIVE METABOLIC PANEL - Abnormal; Notable for the following components:      Result Value   Potassium 2.8 (*)    Chloride 116 (*)    CO2 16 (*)    Glucose, Bld 68 (*)    Calcium 6.6 (*)    Total Protein 5.3 (*)    Albumin  3.1 (*)    AST 140 (*)    Alkaline Phosphatase 29 (*)    All other components within normal limits  CBC - Abnormal; Notable for the following components:   WBC 11.3 (*)    RBC 6.11 (*)    MCV 75.8 (*)    MCHC 36.1 (*)    All other components within normal limits  URINALYSIS, ROUTINE W REFLEX MICROSCOPIC - Abnormal; Notable for the following components:   Glucose, UA 250 (*)    Hgb urine dipstick TRACE (*)    Ketones, ur 40 (*)    All other components within normal limits  CK - Abnormal; Notable for the following components:   Total CK 6,774 (*)    All other components within normal limits  MAGNESIUM - Abnormal; Notable for the following components:   Magnesium 1.2 (*)    All other components within normal limits  PHOSPHORUS - Abnormal; Notable for the following components:   Phosphorus 1.4 (*)    All other components within normal limits  URINALYSIS, MICROSCOPIC (REFLEX) - Abnormal; Notable for the following components:   Bacteria, UA RARE (*)    All other components within normal limits  CBG MONITORING, ED - Abnormal; Notable for the following components:   Glucose-Capillary 193 (*)    All other components within normal limits  LIPASE, BLOOD  RAPID URINE DRUG SCREEN, HOSP PERFORMED    EKG EKG Interpretation  Date/Time:  Tuesday September 08 2018 20:20:03 EDT Ventricular Rate:  106 PR Interval:    QRS Duration: 96 QT Interval:  336 QTC Calculation: 447 R Axis:   -15 Text Interpretation:  Sinus tachycardia Borderline left axis deviation Low voltage, precordial leads Borderline ST elevation, anterolateral leads Artifact in lead(s) V4 V5 V6 and baseline wander in lead(s) II III aVF V6 No previous tracing Confirmed by Gwyneth Sprout ([id number]) on 09/08/2018 8:36:53 PM   Radiology No results found.  Procedures Procedures (including critical care time)  Medications Ordered in ED Medications  0.9 %  sodium chloride infusion ( Intravenous New Bag/Given 09/08/18 2248)   magnesium sulfate IVPB 2 g 50 mL (has no administration in time range)  potassium chloride 10 mEq in 100 mL IVPB (has no administration in time range)  lactated ringers bolus 2,000 mL ( Intravenous Stopped 09/08/18 2227)  dextrose 50 % solution 50 mL (50 mLs Intravenous Given 09/08/18 2139)  potassium chloride 10 mEq in 100 mL IVPB (10 mEq Intravenous New Bag/Given 09/08/18 2225)  LORazepam (ATIVAN) injection 1 mg (1 mg Intravenous Given 09/08/18 2250)     Initial Impression / Assessment and Plan / ED Course  Triage vital signs and the nursing notes have been reviewed.  Pertinent labs & imaging results that were available during care of the patient were reviewed and considered in medical decision making (see chart for details).  Patient presents to the ED for generalized myalgias and muscular cramps x4 days. Patient experiences multiple  cramps while in the ED and is seen tachypneic and thrashing in pain during these episodes. 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 Physical exam grossly unremarkable. No evidence of injury or trauma to the body. Even during muscle cramps, patient's compartments are soft. No specific etiology to patient's complaints. Will order lab work to gain better insight.  Clinical Course as of Sep 08 2344  Tue Sep 08, 2018  2208 Hypokalemic at 2.8, but no associated EKG changes. Other abnormalities include hypocalcemia, elevated AST and mild leukocytosis. Normal kidney function. LR x2 and potassium initially ordered.  Will follow-up labs with CK to evaluate for rhabdomyolysis. Will also add magnesium and phosphorous.   [GM]  2249 Elevated CK at 6774 indicative of rhabdomyolysis. Will continue with aggressive fluid resuscitation. Case discussed with Dr. Gwyneth Sprout. Will consult [facility] for admission.   [GM]  2327 NS continued at 126mL/hr   [GM]  2344 Case discussed with Dr. Katrinka Blazing with [facility] who will admit patient.   [GM]    Clinical Course User Index [GM] Garrison Michie, Sharyon Medicus, PA-C   Final Clinical Impressions(s) / ED Diagnoses  1. Rhabdomyolysis. LR x2L given in the ED. NS continued at for continued fluid resuscitation. Case discussed with [facility] who will admit patient. 2. Hypokalemia. Potassium of 2.8. No associated EKG changes and normal QT. of potassium given in the ED.  Dispo: Admit. Case discussed with [facility].  Final diagnoses:  Non-traumatic rhabdomyolysis  Hypokalemia    ED Discharge Orders    None        Reva Bores 09/08/18 2345    Gwyneth Sprout, MD 09/08/18 2354

## 2018-09-08 NOTE — ED Triage Notes (Addendum)
Pt reports generalized cramping 10/10 and feeling "like im dehydrated". Pt A+OX4, hyperventilating.

## 2018-09-09 ENCOUNTER — Encounter (HOSPITAL_COMMUNITY): Payer: Self-pay | Admitting: *Deleted

## 2018-09-09 LAB — CBC WITH DIFFERENTIAL/PLATELET
BASOS PCT: 0 %
Basophils Absolute: 0 10*3/uL (ref 0.0–0.1)
Eosinophils Absolute: 0.1 10*3/uL (ref 0.0–0.7)
Eosinophils Relative: 1 %
HEMATOCRIT: 40.7 % (ref 39.0–52.0)
HEMOGLOBIN: 14.1 g/dL (ref 13.0–17.0)
LYMPHS ABS: 3.3 10*3/uL (ref 0.7–4.0)
LYMPHS PCT: 36 %
MCH: 27.8 pg (ref 26.0–34.0)
MCHC: 34.6 g/dL (ref 30.0–36.0)
MCV: 80.1 fL (ref 78.0–100.0)
MONO ABS: 0.7 10*3/uL (ref 0.1–1.0)
MONOS PCT: 7 %
NEUTROS ABS: 5.2 10*3/uL (ref 1.7–7.7)
NEUTROS PCT: 56 %
Platelets: 239 10*3/uL (ref 150–400)
RBC: 5.08 MIL/uL (ref 4.22–5.81)
RDW: 13.8 % (ref 11.5–15.5)
WBC: 9.3 10*3/uL (ref 4.0–10.5)

## 2018-09-09 LAB — RAPID URINE DRUG SCREEN, HOSP PERFORMED
AMPHETAMINES: NOT DETECTED
BARBITURATES: NOT DETECTED
BENZODIAZEPINES: NOT DETECTED
Cocaine: NOT DETECTED
Opiates: NOT DETECTED
TETRAHYDROCANNABINOL: POSITIVE — AB

## 2018-09-09 LAB — COMPREHENSIVE METABOLIC PANEL
ALK PHOS: 35 U/L — AB (ref 38–126)
ALT: 47 U/L — ABNORMAL HIGH (ref 0–44)
ALT: 63 U/L — AB (ref 0–44)
ANION GAP: 11 (ref 5–15)
AST: 191 U/L — ABNORMAL HIGH (ref 15–41)
AST: 281 U/L — ABNORMAL HIGH (ref 15–41)
Albumin: 3.6 g/dL (ref 3.5–5.0)
Albumin: 3.6 g/dL (ref 3.5–5.0)
Alkaline Phosphatase: 38 U/L (ref 38–126)
Anion gap: 10 (ref 5–15)
BILIRUBIN TOTAL: 1.6 mg/dL — AB (ref 0.3–1.2)
BILIRUBIN TOTAL: 1.6 mg/dL — AB (ref 0.3–1.2)
BUN: 10 mg/dL (ref 6–20)
BUN: 9 mg/dL (ref 6–20)
CALCIUM: 8.6 mg/dL — AB (ref 8.9–10.3)
CO2: 22 mmol/L (ref 22–32)
CO2: 23 mmol/L (ref 22–32)
CREATININE: 0.96 mg/dL (ref 0.61–1.24)
Calcium: 8.5 mg/dL — ABNORMAL LOW (ref 8.9–10.3)
Chloride: 107 mmol/L (ref 98–111)
Chloride: 110 mmol/L (ref 98–111)
Creatinine, Ser: 1.01 mg/dL (ref 0.61–1.24)
Glucose, Bld: 87 mg/dL (ref 70–99)
Glucose, Bld: 105 mg/dL — ABNORMAL HIGH (ref 70–99)
POTASSIUM: 4.2 mmol/L (ref 3.5–5.1)
Potassium: 4.1 mmol/L (ref 3.5–5.1)
Sodium: 140 mmol/L (ref 135–145)
Sodium: 143 mmol/L (ref 135–145)
TOTAL PROTEIN: 6.1 g/dL — AB (ref 6.5–8.1)
Total Protein: 6.4 g/dL — ABNORMAL LOW (ref 6.5–8.1)

## 2018-09-09 LAB — GLUCOSE, CAPILLARY
GLUCOSE-CAPILLARY: 89 mg/dL (ref 70–99)
GLUCOSE-CAPILLARY: 115 mg/dL — AB (ref 70–99)
Glucose-Capillary: 103 mg/dL — ABNORMAL HIGH (ref 70–99)

## 2018-09-09 LAB — BASIC METABOLIC PANEL
ANION GAP: 11 (ref 5–15)
BUN: 10 mg/dL (ref 6–20)
CO2: 23 mmol/L (ref 22–32)
CREATININE: 1.05 mg/dL (ref 0.61–1.24)
Calcium: 8.5 mg/dL — ABNORMAL LOW (ref 8.9–10.3)
Chloride: 106 mmol/L (ref 98–111)
GFR calc Af Amer: >60 mL/min (ref >60)
GLUCOSE: 92 mg/dL (ref 70–99)
Potassium: 4 mmol/L (ref 3.5–5.1)
Sodium: 140 mmol/L (ref 135–145)

## 2018-09-09 LAB — CK
Total CK: 10511 U/L — ABNORMAL HIGH (ref 49–397)
Total CK: 16008 U/L — ABNORMAL HIGH (ref 49–397)

## 2018-09-09 LAB — MAGNESIUM
MAGNESIUM: 2.3 mg/dL (ref 1.7–2.4)
Magnesium: 2.2 mg/dL (ref 1.7–2.4)

## 2018-09-09 LAB — PHOSPHORUS: Phosphorus: 3.6 mg/dL (ref 2.5–4.6)

## 2018-09-09 MED ORDER — KCL IN DEXTROSE-NACL 40-5-0.45 MEQ/L-%-% IV SOLN
INTRAVENOUS | Status: DC
  Administered 2018-09-09: 08:00:00 via INTRAVENOUS
  Filled 2018-09-09: qty 1000

## 2018-09-09 MED ORDER — ENOXAPARIN SODIUM 40 MG/0.4ML ~~LOC~~ SOLN: 40.0000 mg | SUBCUTANEOUS | Status: DC

## 2018-09-09 MED ORDER — ENOXAPARIN SODIUM 40 MG/0.4ML ~~LOC~~ SOLN
40.0000 mg | SUBCUTANEOUS | Status: DC
  Filled 2018-09-09: qty 0.4

## 2018-09-09 NOTE — H&P (Addendum)
History and Physical  Miqueas Rackers RCB:[id number] DOB: [birth year] DOA: 09/08/2018  PCP: Patient, No Pcp Per   Chief Complaint: Generalized body aches and pains Elevated CK, hypoglycemia, electrolyte disturbance  HPI:  32 year old with polysubstance abuse HTN, shortness of breath, daily smoker,  -see ED progress note 02/01/2016 seen in the emergency room 12/04/2015 for "aggressive behavior"--[facility] was seeing him at that time-he had drug screen positive for cocaine and THC and he was discharged at that time into the custody of [employer] Returns to the emergency room on 09/08/2018 with cramping abdominal pain and was found to have below findings  Patient states his history started 09/05/2018 where and he started having abdominal pain multiple bouts of diarrhea he started having cramping in his abdomen at that time then progressed to have cramping in his legs on 9/8-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 Shares that on 9/10 he could not even move around and walk around in his neighborhood properly-drank to V8's because he thought this could have been secondary to "dehydration" and then decided to come to the emergency room as he could not perform his usual activities  Admits to occasional marijuana use-has not used per his own admission cocaine in the past 2 years secondary to his prior admission to the ED-no current nausea/vomiting Tells me he feels overall better and is hungry-nursing has already ordered him a diet He does not feel strong enough today to go home and has not been out of bed just yet    ED Course: Potassium 2.8 CK 6774 AST/ALT 140/35 glucose 68 magnesium 1.2 He also had a leukocytosis of 11.3 and a hemoglobin of 16.7  Review of Systems:   + Diarrhea, - vomiting, - abdominal pain currently, - chest pain, - headache, - blurred vision, - double vision, - melena, - cough, - cold, - rhinorrhea, - rash, - myalgia, + does have aches in his calves mainly and  according to him they feel "tight" Past Medical History:  Diagnosis Date  . Hypertension     History reviewed. No pertinent surgical history.   reports that he has been smoking. He does not have any smokeless tobacco history on file. He reports that he drinks alcohol. He reports that he has current or past drug history. Drug: Marijuana. Mobility: inde  Allergies  Allergen Reactions  . Advil [Ibuprofen] Other (See Comments)    Feels funny after taking    History reviewed. No pertinent family history.   Prior to Admission medications   Not on File    Physical Exam:   Alert oriented pleasant looking older than stated age thick compared dry mucosa no submandibular lymphadenopathy no icterus no pallor  Chest is clinically clear no added sound no TVR no TVF trachea is midline  S1-S2 no murmur rub or gallop PMI is in normal range  Abdomen is soft nontender no rebound no guarding no hepatosplenomegaly nor other organomegaly appreciated slightly obese  Range of motion to most joints is intact without swelling  No rash  Neurologically intact without any focal deficit EOMI NCAT power 5/5 reflexes deferred  I have personally reviewed following labs and imaging studies  Labs:   WBC down from 11.3-9.3, platelet down from 2 90-239, hemoglobin down from 16.7-14.1  HC positive in urine  Specific gravity 1.010, trace hemoglobin, 40 ketones  Chem-7 and ck panel from this morning are pending  Imaging studies:   no   Medical tests:   EKG independently reviewed: yes sinus  bradycardia also on monitors-no need for telemetry  Test discussed with performing physician:  n   Decision to obtain old records:   n   Review and summation of old records:   y   Active Problems:   Rhabdomyolysis   Assessment/Plan Volume depletion + diarrheal losses of potassium Rhabdomyolysis nontraumatic-likely secondary to viral illness Hypokalemia as above THC user Prior cocaine  use Hypertension not on meds   Volume depletion-continue saline-Place 40 of K lower rate to 100 cc/h-rechecking magnesium and replace if needed  Leukocytosis White count down from 11 and likely secondary to hemoconcentration as referenced by also drop in hemoglobin  THC use-needs outpatient counseling-pre-contemplative  HTN-shares with me he is 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 and more importantly if not having myalgias reasonable to discharge a.m. 9/12 Suspect that his mild transaminitis is secondary to body habitus versus rhabdo and expect that a.m. labs would show resolution of the same --I would not treat his muscle aches and pains with opioids or other meds other than Tylenol given his prior history He may get up and shower as long as he is able to do so he can ambulate around the unit      Severity of Illness: The appropriate patient status for this patient is OBSERVATION. Observation status is judged to be reasonable and necessary in order to provide the required intensity of service to ensure the patient's safety. The patient's presenting symptoms, physical exam findings, and initial radiographic and laboratory data in the context of their medical condition is felt to place them at decreased risk for further clinical deterioration. Furthermore, it is anticipated that the patient will be medically stable for discharge from the hospital within 2 midnights of admission. The following factors support the patient status of observation.   " The patient's presenting symptoms include Russell aches pains and discomfort. " The physical exam findings include muscle aches. " The initial radiographic and laboratory data are somewhat reassuring  he does not need telemetry and I think he can go home tomorrow.   Addend-CK now 10,000 and mild rise Creat--rpt labs in pm 1400--if no better-convert to IP--nursing to infomr when labs available     DVT prophylaxis: Lovenox Code Status: Full Family Communication: No Consults called: No  Time spent: 45 minutes  Pleas Koch, MD [facility] 7:34 AM  09/09/2018, 7:06 AM

## 2018-09-09 NOTE — Progress Notes (Signed)
Pt arrived to unit via stretcher from [facility] to room 1505. Alert and oriented x4, steady gait, weak. VS taken. Pt oriented to roomand callbell with no complications. Pt guide at the bedside.initial assessment completed. Attending notified of pt's arrival. Will continue to monitor

## 2018-09-10 DIAGNOSIS — M6282 Rhabdomyolysis: Principal | ICD-10-CM

## 2018-09-10 DIAGNOSIS — E876 Hypokalemia: Secondary | ICD-10-CM

## 2018-09-10 LAB — COMPREHENSIVE METABOLIC PANEL
ALK PHOS: 31 U/L — AB (ref 38–126)
ALT: 75 U/L — AB (ref 0–44)
AST: 305 U/L — AB (ref 15–41)
Albumin: 3.2 g/dL — ABNORMAL LOW (ref 3.5–5.0)
Anion gap: 8 (ref 5–15)
BUN: 7 mg/dL (ref 6–20)
CALCIUM: 8.2 mg/dL — AB (ref 8.9–10.3)
CHLORIDE: 108 mmol/L (ref 98–111)
CO2: 24 mmol/L (ref 22–32)
CREATININE: 0.88 mg/dL (ref 0.61–1.24)
GFR calc Af Amer: >60 mL/min (ref >60)
Glucose, Bld: 101 mg/dL — ABNORMAL HIGH (ref 70–99)
Potassium: 4 mmol/L (ref 3.5–5.1)
Sodium: 140 mmol/L (ref 135–145)
Total Bilirubin: 1.4 mg/dL — ABNORMAL HIGH (ref 0.3–1.2)
Total Protein: 5.9 g/dL — ABNORMAL LOW (ref 6.5–8.1)

## 2018-09-10 LAB — CK: Total CK: 18788 U/L — ABNORMAL HIGH (ref 49–397)

## 2018-09-10 MED ORDER — SODIUM CHLORIDE 0.45 % IV SOLN
INTRAVENOUS | Status: AC
  Administered 2018-09-10 – 2018-09-11 (×5): via INTRAVENOUS

## 2018-09-10 NOTE — Care Management (Signed)
  Discharge Readiness Milestones  (1st Day of chart review)  MCG Used: rhabdo/musclosketal diease Optimal GLOS: 1-2 Hospital Day: 1  Discharge Readiness Milestones  Identified Barriers: Date/Name case discussed with attending MD (if applicable):  Date/name case discussed with supervisor (if applicable):  Date/name case referred to physician advisor (if applicable)   Concurrent Review - Discharge Readiness Milestones, Not Met       Discharge Criteria Return to top of Musculoskeletal Disease GRG - GRG [Expand All / Collapse All]  Continued inpatient stay is needed until 1 or more of the following are present: ? Acceptable patient status for next level of care is achieved. ? ALL of the following are present:   Temperature status acceptable    No infection, or status acceptable   C-reactive protein  Total ck=16,008   Respiratory status acceptable    Neurologic status acceptable    Renal function acceptable    Vascular, soft tissue, and wound status acceptable    Pain and nausea absent or adequately managed    Fracture or injury absent or status acceptable    No bone and joint infection, or status acceptable    Rheumatologic and vasculitic status acceptable    Activity level acceptable    Intake acceptable  Hx of polysubstance abuse    32 year old with polysubstance abuse HTN, shortness of breath, daily smoker,  -see ED progress note 02/01/2016 seen in the emergency room 12/04/2015 for "aggressive behavior"--[facility] was seeing him at that time-he had drug screen positive for cocaine and THC and he was discharged at that time into the custody of [facility] Returns to the emergency room on 09/08/2018 with cramping abdominal pain and was found to have below findings  Patient states his history started 09/05/2018 where and he started having abdominal pain multiple bouts of diarrhea he started having cramping in his abdomen at that time then progressed to have cramping  in his legs on 9/8-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, nature and felt increased cramps Shares that on 9/10 he could not even move around and walk around in his neighborhood properly-drank to V8's because he thought this could have been secondary to "dehydration" and then decided to come to the emergency room as he could not perform his usual activities  Admits to occasional marijuana use-has not used per his own admission cocaine in the past 2 years secondary to his prior admission to the ED-no current nausea/vomiting Tells me he feels overall better and is hungry-nursing has already ordered him a diet He does not feel strong enough today to go home and has not been out of bed just yet    ED Course: Potassium 2.8 CK 6774 AST/ALT 140/35 glucose 68 magnesium 1.2 He also had a leukocytosis of 11.3 and a hemoglobin of 16.7

## 2018-09-10 NOTE — Progress Notes (Addendum)
PROGRESS NOTE  Nollan Muldrow ONG:[id number] DOB: 1986-07-24 DOA: 09/08/2018 PCP: Patient, No Pcp Per  HPI/Recap of past 58 hours: 32 year old with polysubstance abuse including marijuana and tobacco, HTN who presented to the ED at [facility] with complaints of allover body cramps and excessive diaphoresis.  Patient was moving boxes at the time of this event.  Denies any new medications or cocaine use.  Upon presentation CPK elevated and trended up to over 6 thousand.  Admitted for rhabdomyolysis.  09/10/2018: Patient seen and examined at his bedside.  Reports persistent muscle pain involving his thighs and arms bilaterally.  His CPK is up to greater than 18,000.  Increased rate of IV fluid half-normal saline at 200 cc/h.  Assessment/Plan: Active Problems:   Rhabdomyolysis  Rhabdomyolysis most likely secondary to strenuous physical activity CPK continues to trend up greater than 18,000 Continue IV fluid half-normal saline at 200 cc/h Patient cannot be discharged today due to increased risk of developing acute kidney injury, if not provided high rate of IV fluid infusion, in the setting of worsening rhabdomyolysis. Repeat CPK in the morning Repeat BMP also in the morning Monitor urine output  Elevated AST and ALT Unclear etiology Obtain abdominal ultrasound Obtain hepatitis viral panel  Hyperbilirubinemia, unclear etiology Total bilirubin 1.4 Trending down from 1.6 on admission No sign of overt bleeding No anemia No thrombocytopenia  Accelerated hypertension Possibly worsened by muscle pain Not on any antihypertensive medications at home Start amlodipine 5 mg daily Continue to monitor vital signs  Transient bradycardia and tachycardia Continue to monitor  Polysubstance abuse including marijuana and tobacco Polysubstance abuse cessation counseling done at bedside Nicotine patch  Risk: High risk for decompensation due to worsening rhabdomyolysis.  Patient status will be changed  from observation to inpatient as the patient requires at least 2 midnights for high rate of IV fluid to prevent an acute kidney injury or other complications in the setting of worsening rhabdomyolysis.   Code Status: Full code  Family Communication: None at bedside  Disposition Plan: Home possibly in 1 to 2 days when CPK trended down.   Consultants:  None  Procedures:  None  Antimicrobials:  None  DVT prophylaxis: Subcu Lovenox daily   Objective: Vitals:   09/09/18 1322 09/09/18 2104 09/10/18 0501 09/10/18 1352  BP: (!) 153/98 (!) 134/92 (!) 130/95 (!) 146/91  Pulse: 63 71 80 (!) 47  Resp: 18 18 16 18   Temp: 97.7 F (36.5 C) 98.7 F (37.1 C) 98 F (36.7 C) 98.1 F (36.7 C)  TempSrc:  Oral Oral   SpO2: 97% 100% 100% 100%  Weight:      Height:        Intake/Output Summary (Last 24 hours) at 09/10/2018 1449 Last data filed at 09/10/2018 1429 Gross per 24 hour  Intake 1507.19 ml  Output -  Net 1507.19 ml   Filed Weights   09/08/18 2022 09/09/18 0144  Weight: 107.5 kg 101.6 kg    Exam:  . General: 32 y.o. year-old male well developed well nourished in no acute distress.  Alert and oriented x3. . Cardiovascular: Regular rate and rhythm with no rubs or gallops.  No thyromegaly or JVD noted.   Marland Kitchen Respiratory: Clear to auscultation with no wheezes or rales. Good inspiratory effort. . Abdomen: Soft nontender nondistended with normal bowel sounds x4 quadrants. . Musculoskeletal: No lower extremity edema. 2/4 pulses in all 4 extremities. . Skin: No ulcerative lesions noted or rashes, . Psychiatry: Mood is appropriate for condition and setting  Data Reviewed: CBC: Recent Labs  Lab 09/08/18 2021 09/09/18 0627  WBC 11.3* 9.3  NEUTROABS  --  5.2  HGB 16.7 14.1  HCT 46.3 40.7  MCV 75.8* 80.1  PLT 290 239   Basic Metabolic Panel: Recent Labs  Lab 09/08/18 2021 09/09/18 0627 09/09/18 1345 09/10/18 0532  NA 142 140  140 143 140  K 2.8* 4.1  4.0 4.2  4.0  CL 116* 107  106 110 108  CO2 16* 22  23 23 24   GLUCOSE 68* 87  92 105* 101*  BUN 11 10  10 9 7   CREATININE 0.86 1.01  1.05 0.96 0.88  CALCIUM 6.6* 8.6*  8.5* 8.5* 8.2*  MG 1.2* 2.3  2.2  --   --   PHOS 1.4* 3.6  --   --    GFR: Estimated Creatinine Clearance: 138.1 mL/min (by C-G formula based on SCr of 0.88 mg/dL). Liver Function Tests: Recent Labs  Lab 09/08/18 2021 09/09/18 0627 09/09/18 1345 09/10/18 0532  AST 140* 191* 281* 305*  ALT 35 47* 63* 75*  ALKPHOS 29* 35* 38 31*  BILITOT 1.1 1.6* 1.6* 1.4*  PROT 5.3* 6.1* 6.4* 5.9*  ALBUMIN 3.1* 3.6 3.6 3.2*   Recent Labs  Lab 09/08/18 2021  LIPASE 21   No results for input(s): AMMONIA in the last 168 hours. Coagulation Profile: No results for input(s): INR, PROTIME in the last 168 hours. Cardiac Enzymes: Recent Labs  Lab 09/08/18 2021 09/09/18 0627 09/09/18 1345 09/10/18 0532  CKTOTAL 6,774* 10,511* 16,008* 18,788*   BNP (last 3 results) No results for input(s): PROBNP in the last 8760 hours. HbA1C: No results for input(s): HGBA1C in the last 72 hours. CBG: Recent Labs  Lab 09/08/18 2158 09/09/18 0741 09/09/18 1157 09/09/18 1609  GLUCAP 193* 89 103* 115*   Lipid Profile: No results for input(s): CHOL, HDL, LDLCALC, TRIG, CHOLHDL, LDLDIRECT in the last 72 hours. Thyroid Function Tests: No results for input(s): TSH, T4TOTAL, FREET4, T3FREE, THYROIDAB in the last 72 hours. Anemia Panel: No results for input(s): VITAMINB12, FOLATE, FERRITIN, TIBC, IRON, RETICCTPCT in the last 72 hours. Urine analysis:    Component Value Date/Time   COLORURINE YELLOW 09/08/2018 2251   APPEARANCEUR CLEAR 09/08/2018 2251   LABSPEC 1.010 09/08/2018 2251   PHURINE 6.0 09/08/2018 2251   GLUCOSEU 250 (A) 09/08/2018 2251   HGBUR TRACE (A) 09/08/2018 2251   BILIRUBINUR NEGATIVE 09/08/2018 2251   KETONESUR 40 (A) 09/08/2018 2251   PROTEINUR NEGATIVE 09/08/2018 2251   UROBILINOGEN 0.2 07/23/2012 0518   NITRITE  NEGATIVE 09/08/2018 2251   LEUKOCYTESUR NEGATIVE 09/08/2018 2251   Sepsis Labs: @LABRCNTIP (procalcitonin:4,lacticidven:4)  )No results found for this or any previous visit (from the past 240 hour(s)).    Studies: No results found.  Scheduled Meds: . enoxaparin (LOVENOX) injection  40 mg Subcutaneous Q24H  . phosphorus  500 mg Oral BID    Continuous Infusions: . sodium chloride 200 mL/hr at 09/10/18 1248     LOS: 2 days     Darlin Droparole N Sumedh Shinsato, MD [facility] Pager [phone]  If 7PM-7AM, please contact night-coverage [email or web address] Password [facility] 09/10/2018, 2:49 PM

## 2018-09-11 ENCOUNTER — Inpatient Hospital Stay (HOSPITAL_COMMUNITY): Payer: Self-pay

## 2018-09-11 LAB — CK: Total CK: 21885 U/L — ABNORMAL HIGH (ref 49–397)

## 2018-09-11 LAB — COMPREHENSIVE METABOLIC PANEL
ALK PHOS: 35 U/L — AB (ref 38–126)
ALT: 97 U/L — AB (ref 0–44)
AST: 348 U/L — AB (ref 15–41)
Albumin: 3.3 g/dL — ABNORMAL LOW (ref 3.5–5.0)
Anion gap: 8 (ref 5–15)
BUN: 7 mg/dL (ref 6–20)
CALCIUM: 8.6 mg/dL — AB (ref 8.9–10.3)
CO2: 27 mmol/L (ref 22–32)
CREATININE: 0.9 mg/dL (ref 0.61–1.24)
Chloride: 107 mmol/L (ref 98–111)
GFR calc Af Amer: >60 mL/min (ref >60)
GFR calc non Af Amer: >60 mL/min (ref >60)
Glucose, Bld: 107 mg/dL — ABNORMAL HIGH (ref 70–99)
Potassium: 3.8 mmol/L (ref 3.5–5.1)
Sodium: 142 mmol/L (ref 135–145)
Total Bilirubin: 1.2 mg/dL (ref 0.3–1.2)
Total Protein: 6.1 g/dL — ABNORMAL LOW (ref 6.5–8.1)

## 2018-09-11 MED ORDER — ACETAMINOPHEN 325 MG PO TABS: 650.0000 mg | ORAL_TABLET | Freq: Four times a day (QID) | ORAL | Status: DC | PRN

## 2018-09-11 MED ORDER — SODIUM CHLORIDE 0.45 % IV SOLN
INTRAVENOUS | Status: AC
  Administered 2018-09-11 – 2018-09-12 (×3): via INTRAVENOUS

## 2018-09-11 MED ORDER — AMLODIPINE BESYLATE 5 MG PO TABS
5.0000 mg | ORAL_TABLET | Freq: Every day | ORAL | Status: DC
  Administered 2018-09-11: 5 mg via ORAL
  Filled 2018-09-11: qty 1

## 2018-09-11 MED ORDER — SUMATRIPTAN SUCCINATE 25 MG PO TABS
25.0000 mg | ORAL_TABLET | Freq: Two times a day (BID) | ORAL | Status: DC | PRN
  Filled 2018-09-11: qty 1

## 2018-09-11 MED ORDER — AMLODIPINE BESYLATE 10 MG PO TABS
10.0000 mg | ORAL_TABLET | Freq: Every day | ORAL | Status: DC
  Administered 2018-09-12 – 2018-09-15 (×4): 10 mg via ORAL
  Filled 2018-09-11 (×4): qty 1

## 2018-09-11 NOTE — Progress Notes (Signed)
PROGRESS NOTE  [name] Ramirez WUJ:[id number] DOB: 03-15-1986 DOA: 09/08/2018 PCP: Patient, No Pcp Per  HPI/Recap of past 66 hours: 32 year old with polysubstance abuse including marijuana and tobacco, HTN who presented to the ED at [facility] with complaints of allover body cramps and excessive diaphoresis.  Patient was moving boxes at the time of this event.  Denies any new medications or cocaine use.  Upon presentation CPK elevated and trended up to over 6 thousand.  Admitted for rhabdomyolysis.  09/10/2018: Patient seen and examined at his bedside.  Reports persistent muscle pain involving his thighs and arms bilaterally.  His CPK is up to greater than 18,000.  Increased rate of IV fluid half-normal saline at 200 cc/h.  09/11/18: Patient seen and examined at his bedside.  Continues to report persistent muscle ache affecting his upper extremities and lower extremities.  On 200 cc/h of half-normal saline and CPK still trending up to 22,000.  Denies chest pain.  Assessment/Plan: Active Problems:   Rhabdomyolysis  Rhabdomyolysis most likely secondary to strenuous physical activity CPK continues to trend up greater than 22,000 from 18,000 Continue IV fluid half-normal saline at 200 cc/h Patient cannot be discharged today due to increased risk of developing acute kidney injury, if not provided high rate of IV fluid infusion, in the setting of worsening rhabdomyolysis. Repeat CPK in the morning Repeat BMP also in the morning Continue to monitor urine output  Elevated AST and ALT Unclear etiology Obtain abdominal ultrasound Obtain hepatitis viral panel Avoid hepatotoxic agents  Hyperbilirubinemia, unclear etiology Total bilirubin 1.4 Trending down from 1.6 on admission No sign of overt bleeding No anemia No thrombocytopenia  Accelerated hypertension Blood pressure still elevated Possibly worsened by muscle pain Not on any antihypertensive medications at home Continue amlodipine and  increase dose from 5 mg daily to 10 mg daily  Continue to monitor vital signs  Transient bradycardia and tachycardia Continue to monitor  Polysubstance abuse including marijuana and tobacco Polysubstance abuse cessation counseling done at bedside Nicotine patch  Risk: High risk for decompensation due to worsening rhabdomyolysis.  Patient status will be changed from observation to inpatient as the patient requires at least 2 midnights for high rate of IV fluid to prevent an acute kidney injury or other complications in the setting of worsening rhabdomyolysis.   Code Status: Full code  Family Communication: None at bedside  Disposition Plan: Home possibly in 1 to 2 days when CPK trended down.   Consultants:  None  Procedures:  None  Antimicrobials:  None  DVT prophylaxis: Subcu Lovenox daily   Objective: Vitals:   09/10/18 2105 09/11/18 0444 09/11/18 1100 09/11/18 1343  BP: (!) 140/98 140/89  (!) 153/97  Pulse: (!) 52 (!) 55  (!) 48  Resp: 19 16  20   Temp: 98.4 F (36.9 C) 98.2 F (36.8 C)  98.4 F (36.9 C)  TempSrc: Oral     SpO2: 100% 100%  98%  Weight:   102.2 kg   Height:        Intake/Output Summary (Last 24 hours) at 09/11/2018 1624 Last data filed at 09/11/2018 1550 Gross per 24 hour  Intake 4844.27 ml  Output 3700 ml  Net 1144.27 ml   Filed Weights   09/08/18 2022 09/09/18 0144 09/11/18 1100  Weight: 107.5 kg 101.6 kg 102.2 kg    Exam:  . General: 32 y.o. year-old male well-developed well-nourished in no acute distress.  Alert and oriented x3. . Cardiovascular: Regular rate and rhythm with no rubs or  gallops.  No thyromegaly or JVD noted. Marland Kitchen Respiratory: Clear to auscultation with no wheezes or rales. Good inspiratory effort. . Abdomen: Soft nontender nondistended with normal bowel sounds x4 quadrants. . Musculoskeletal: No lower extremity edema. 2/4 pulses in all 4 extremities. . Skin: No ulcerative lesions noted or rashes, . Psychiatry:  Mood is appropriate for condition and setting   Data Reviewed: CBC: Recent Labs  Lab 09/08/18 2021 09/09/18 0627  WBC 11.3* 9.3  NEUTROABS  --  5.2  HGB 16.7 14.1  HCT 46.3 40.7  MCV 75.8* 80.1  PLT 290 239   Basic Metabolic Panel: Recent Labs  Lab 09/08/18 2021 09/09/18 0627 09/09/18 1345 09/10/18 0532 09/11/18 0511  NA 142 140  140 143 140 142  K 2.8* 4.1  4.0 4.2 4.0 3.8  CL 116* 107  106 110 108 107  CO2 16* 22  23 23 24 27   GLUCOSE 68* 87  92 105* 101* 107*  BUN 11 10  10 9 7 7   CREATININE 0.86 1.01  1.05 0.96 0.88 0.90  CALCIUM 6.6* 8.6*  8.5* 8.5* 8.2* 8.6*  MG 1.2* 2.3  2.2  --   --   --   PHOS 1.4* 3.6  --   --   --    GFR: Estimated Creatinine Clearance: 135.4 mL/min (by C-G formula based on SCr of 0.9 mg/dL). Liver Function Tests: Recent Labs  Lab 09/08/18 2021 09/09/18 0627 09/09/18 1345 09/10/18 0532 09/11/18 0511  AST 140* 191* 281* 305* 348*  ALT 35 47* 63* 75* 97*  ALKPHOS 29* 35* 38 31* 35*  BILITOT 1.1 1.6* 1.6* 1.4* 1.2  PROT 5.3* 6.1* 6.4* 5.9* 6.1*  ALBUMIN 3.1* 3.6 3.6 3.2* 3.3*   Recent Labs  Lab 09/08/18 2021  LIPASE 21   No results for input(s): AMMONIA in the last 168 hours. Coagulation Profile: No results for input(s): INR, PROTIME in the last 168 hours. Cardiac Enzymes: Recent Labs  Lab 09/08/18 2021 09/09/18 0627 09/09/18 1345 09/10/18 0532 09/11/18 0511  CKTOTAL 6,774* 10,511* 16,008* 18,788* 21,885*   BNP (last 3 results) No results for input(s): PROBNP in the last 8760 hours. HbA1C: No results for input(s): HGBA1C in the last 72 hours. CBG: Recent Labs  Lab 09/08/18 2158 09/09/18 0741 09/09/18 1157 09/09/18 1609  GLUCAP 193* 89 103* 115*   Lipid Profile: No results for input(s): CHOL, HDL, LDLCALC, TRIG, CHOLHDL, LDLDIRECT in the last 72 hours. Thyroid Function Tests: No results for input(s): TSH, T4TOTAL, FREET4, T3FREE, THYROIDAB in the last 72 hours. Anemia Panel: No results for  input(s): VITAMINB12, FOLATE, FERRITIN, TIBC, IRON, RETICCTPCT in the last 72 hours. Urine analysis:    Component Value Date/Time   COLORURINE YELLOW 09/08/2018 2251   APPEARANCEUR CLEAR 09/08/2018 2251   LABSPEC 1.010 09/08/2018 2251   PHURINE 6.0 09/08/2018 2251   GLUCOSEU 250 (A) 09/08/2018 2251   HGBUR TRACE (A) 09/08/2018 2251   BILIRUBINUR NEGATIVE 09/08/2018 2251   KETONESUR 40 (A) 09/08/2018 2251   PROTEINUR NEGATIVE 09/08/2018 2251   UROBILINOGEN 0.2 07/23/2012 0518   NITRITE NEGATIVE 09/08/2018 2251   LEUKOCYTESUR NEGATIVE 09/08/2018 2251   Sepsis Labs: @LABRCNTIP (procalcitonin:4,lacticidven:4)  )No results found for this or any previous visit (from the past 240 hour(s)).    Studies: No results found.  Scheduled Meds: . amLODipine  5 mg Oral Daily  . enoxaparin (LOVENOX) injection  40 mg Subcutaneous Q24H  . phosphorus  500 mg Oral BID    Continuous Infusions: . sodium chloride  200 mL/hr at 09/11/18 1350     LOS: 3 days     Darlin Droparole N Hall, MD [facility] Pager [phone]  If 7PM-7AM, please contact night-coverage [email or web address] Password [email or web address] 09/11/2018, 4:24 PM

## 2018-09-12 ENCOUNTER — Inpatient Hospital Stay (HOSPITAL_COMMUNITY): Payer: Self-pay

## 2018-09-12 LAB — CK: CK TOTAL: 4255 U/L — AB (ref 49–397)

## 2018-09-12 LAB — HEPATITIS PANEL, ACUTE
HCV Ab: <0.1 s/co ratio (ref 0.0–0.9)
HEP B C IGM: NEGATIVE
Hep A IgM: NEGATIVE
Hepatitis B Surface Ag: NEGATIVE

## 2018-09-12 LAB — COMPREHENSIVE METABOLIC PANEL
ALK PHOS: 38 U/L (ref 38–126)
ALT: 117 U/L — ABNORMAL HIGH (ref 0–44)
AST: 382 U/L — AB (ref 15–41)
Albumin: 3.9 g/dL (ref 3.5–5.0)
Anion gap: 11 (ref 5–15)
BUN: 7 mg/dL (ref 6–20)
CALCIUM: 9.2 mg/dL (ref 8.9–10.3)
CHLORIDE: 105 mmol/L (ref 98–111)
CO2: 25 mmol/L (ref 22–32)
CREATININE: 0.73 mg/dL (ref 0.61–1.24)
GFR calc Af Amer: >60 mL/min (ref >60)
Glucose, Bld: 98 mg/dL (ref 70–99)
Potassium: 4 mmol/L (ref 3.5–5.1)
Sodium: 141 mmol/L (ref 135–145)
Total Bilirubin: 1.1 mg/dL (ref 0.3–1.2)
Total Protein: 6.7 g/dL (ref 6.5–8.1)

## 2018-09-12 MED ORDER — SODIUM CHLORIDE 0.9 % IV SOLN
INTRAVENOUS | Status: DC
  Administered 2018-09-12 – 2018-09-15 (×8): via INTRAVENOUS

## 2018-09-12 MED ORDER — ACETAMINOPHEN 325 MG PO TABS
650.0000 mg | ORAL_TABLET | ORAL | Status: DC | PRN
  Administered 2018-09-12: 650 mg via ORAL
  Filled 2018-09-12: qty 2

## 2018-09-12 NOTE — Progress Notes (Addendum)
[name] Ramirez Ramirez Kitchen  PROGRESS NOTE  [name] Ramirez Ramirez ZOX:[id number] DOB: 11-11-1986 DOA: 09/08/2018 PCP: Patient, No Pcp Per  Brief Narrative:  HPI/Recap of past 70 hours: 32 year old with polysubstance abuse including marijuana and tobacco, HTN who presented to the ED at [facility] with complaints of allover body cramps and excessive diaphoresis.  Patient was moving boxes at the time of this event.  Denies any new medications or cocaine use.  Upon presentation CPK elevated and trended up to over 6 thousand.  Admitted for rhabdomyolysis.   Interval history/HPI/Recap of past 65 hours: 31 year old with polysubstance abuse including marijuana and tobacco, HTN who was admitted for rhabdomyolysis.  On September 11, 2018 he is CPK went up to 22,000 he continued to be on IV normal saline 150 mils per hour his kidney functions where monitored and doing well Subjective: Seen at bedside.  His CK has started to trend down today was  up to 22,000 yesterday down to 4255 today patient was insisting on leaving AGAINST MEDICAL ADVICE.  Explained to him even though that is giving him fluid and he is peeing a lot which was his complaint that that is what is helping his kidneys not to fail.  He was finally convinced to stay.  Assessment/Plan Rhabdomyolysis most likely sided to strenuous activities his CPK is coming down we will continue to monitor CPK level as well as renal function and continue IV fluid   Hypertension continue amlodipine his dose was increased from 5 mg to 10 mg we will continue with that  DVT prophylaxis: Subcutaneous Lovenox Code Status: Full Family Communication: Father mother and sister in the room Disposition Plan: Home once CPK level comes down to reasonable level  Dr. Barrie Folk [facility] Pager [phone]  09/12/2018, 10:59 AM  LOS: 4 days   Consultants:  None  Procedures:  None  Antimicrobials:  None   Objective: Vitals:  Vitals:   09/11/18 2055 09/12/18 0529  BP: (!) 145/97 131/85    Pulse: 61 (!) 51  Resp: 18 18  Temp: 98.7 F (37.1 C) (!) 97.4 F (36.3 C)  SpO2: 100% 100%    Exam:  Constitutional:  . Appears calm and comfortable Eyes:  . pupils and irises appear normal . Normal lids and conjunctivae ENMT:  . grossly normal hearing  . Lips appear normal . external ears, nose appear normal . Oropharynx: mucosa, tongue,posterior pharynx appear normal Neck:  . neck appears normal, no masses, normal ROM, supple . no thyromegaly Respiratory:  . CTA bilaterally, no w/r/r.  . Respiratory effort normal. No retractions or accessory muscle use Cardiovascular:  . RRR, no m/r/g . No LE extremity edema   . Normal pedal pulses Abdomen:  . Abdomen appears normal; no tenderness or masses . No hernias . No HSM Musculoskeletal:  . Digits/nails BUE: no clubbing, cyanosis, petechiae, infection . exam of joints, bones, muscles of at least one of following: head/neck, RUE, LUE, RLE, LLE   o strength and tone normal, no atrophy, no abnormal movements o No tenderness, masses o Normal ROM, no contractures  . gait and station Skin:  . No rashes, lesions, ulcers . palpation of skin: no induration or nodules Neurologic:  . CN 2-12 intact . Sensation all 4 extremities intact Psychiatric:  . Mental status o Mood, affect appropriate o Orientation to person, place, time  . judgment and insight appear intact     I have personally reviewed the following:   Data:  EXAM: ABDOMEN ULTRASOUND COMPLETE  COMPARISON:  None.  FINDINGS: Gallbladder:  No gallstones or wall thickening visualized. No sonographic Murphy sign noted by sonographer.  Common bile duct: Diameter: 4 mm  Liver: No focal lesion identified. Within normal limits in parenchymal echogenicity. Portal vein is patent on color Doppler imaging with normal direction of blood flow towards the liver.  IVC: No abnormality visualized.  Pancreas: Visualized portion unremarkable.  Spleen: Size and  appearance within normal limits.  Right Kidney: Length: 10.5 cm. Echogenicity within normal limits. No mass or hydronephrosis visualized.  Left Kidney: Length: 9.5 Cm, but underestimated due to obliquity of imaging. Echogenicity within normal limits. No mass or hydronephrosis visualized.  Abdominal aorta: No aneurysm visualized.  Other findings: None.  IMPRESSION: Negative abdominal ultrasound.   Electronically Signed   By: Marnee SpringJonathon  Ramirez M.D.   On: 09/12/2018 09:02 Results for [name] Ramirez Ramirez, [name] Ramirez (MRN [id number]) as of 09/13/2018 08:30  Ref. Range 09/12/2018 05:25  COMPREHENSIVE METABOLIC PANEL Unknown Rpt (A)  Sodium Latest Ref Range: 135 - 145 mmol/L 141  Potassium Latest Ref Range: 3.5 - 5.1 mmol/L 4.0  Chloride Latest Ref Range: 98 - 111 mmol/L 105  CO2 Latest Ref Range: 22 - 32 mmol/L 25  Glucose Latest Ref Range: 70 - 99 mg/dL 98  BUN Latest Ref Range: 6 - 20 mg/dL 7  Creatinine Latest Ref Range: 0.61 - 1.24 mg/dL 4.090.73  Calcium Latest Ref Range: 8.9 - 10.3 mg/dL 9.2  Anion gap Latest Ref Range: 5 - 15  11  Alkaline Phosphatase Latest Ref Range: 38 - 126 U/L 38  Albumin Latest Ref Range: 3.5 - 5.0 g/dL 3.9  AST Latest Ref Range: 15 - 41 U/L 382 (H)  ALT Latest Ref Range: 0 - 44 U/L 117 (H)  Total Protein Latest Ref Range: 6.5 - 8.1 g/dL 6.7  Total Bilirubin Latest Ref Range: 0.3 - 1.2 mg/dL 1.1  GFR, Est Non African American Latest Ref Range: >60 mL/min >60  GFR, Est African American Latest Ref Range: >60 mL/min >60  CK Total Latest Ref Range: 49 - 397 U/L 4,255 (H)   Scheduled Meds: . amLODipine  10 mg Oral Daily  . enoxaparin (LOVENOX) injection  40 mg Subcutaneous Q24H  . phosphorus  500 mg Oral BID   Continuous Infusions: . sodium chloride 150 mL/hr at 09/12/18 0630    Active Problems:   Rhabdomyolysis   LOS: 4 days

## 2018-09-13 ENCOUNTER — Encounter (HOSPITAL_COMMUNITY): Payer: Self-pay

## 2018-09-13 DIAGNOSIS — I10 Essential (primary) hypertension: Secondary | ICD-10-CM

## 2018-09-13 LAB — MAGNESIUM: Magnesium: 1.7 mg/dL (ref 1.7–2.4)

## 2018-09-13 LAB — BASIC METABOLIC PANEL
ANION GAP: 9 (ref 5–15)
BUN: 6 mg/dL (ref 6–20)
CALCIUM: 8.9 mg/dL (ref 8.9–10.3)
CHLORIDE: 104 mmol/L (ref 98–111)
CO2: 27 mmol/L (ref 22–32)
Creatinine, Ser: 0.86 mg/dL (ref 0.61–1.24)
GFR calc Af Amer: >60 mL/min (ref >60)
GFR calc non Af Amer: >60 mL/min (ref >60)
GLUCOSE: 89 mg/dL (ref 70–99)
Potassium: 3.6 mmol/L (ref 3.5–5.1)
Sodium: 140 mmol/L (ref 135–145)

## 2018-09-13 LAB — ALKALINE PHOSPHATASE: Alkaline Phosphatase: 40 U/L (ref 38–126)

## 2018-09-13 LAB — PHOSPHORUS: PHOSPHORUS: 3.2 mg/dL (ref 2.5–4.6)

## 2018-09-13 LAB — CK: Total CK: 13242 U/L — ABNORMAL HIGH (ref 49–397)

## 2018-09-13 NOTE — Progress Notes (Addendum)
[name] Ramirez.  PROGRESS NOTE  [name] ZOX:[id number] DOB: 31-Oct-1986 DOA: 09/08/2018 PCP: Patient, No Pcp Per  Brief Narrative:  HPI/Recap of past 5424 hours: 32 year old with polysubstance abuse including marijuana and tobacco, HTN who [facility] with complaints of allover body cramps and excessive diaphoresis.  Patient was moving boxes at the time of this event.  Denies any new medications or cocaine use.  Upon presentation CPK elevated and trended up to over 6 thousand.  Admitted for rhabdomyolysis.   Interval history/HPI/Recap of past 4024 hours: 32 year old with polysubstance abuse including marijuana and tobacco, HTN who was admitted for rhabdomyolysis.  On September 11, 2018 he is CPK went up to 22,000 he continued to be on IV normal saline 150 mils per hour his kidney functions where monitored and doing well Subjective: Seen at bedside.  His CK has started to trend down today was  up to 22,000 yesterday down to 4255 today patient was insisting on leaving AGAINST MEDICAL ADVICE.  Explained to him even though that is giving him fluid and he is peeing a lot which was his complaint that that is what is helping his kidneys not to fail.  He was finally convinced to stay. September 13, 2018:.  Patient understanding more of what is going on and the treatment plan he is not threatening to sign out AGAINST MEDICAL ADVICE.  He wants to stay and comply and complete with his treatment.  His CK level went up to 13,000 this morning from 4200 yesterday  Assessment/Plan Rhabdomyolysis most likely due to strenuous activities and he is very muscular.  His CPK is went back up to 13,000 this morning, we will continue to monitor CPK level as well as renal function and continue IV fluid   Hypertension continue amlodipine his dose was increased from 5 mg to 10 mg we will continue with that and he stated he used to be on hydrochlorothiazide and lisinopril 10 mg dose were held on admission due to his  rhabdomyolysis  Abnormal liver function.  He is extensively tattooed.  I will check his hepatitis profile and repeat liver function.  His liver ultrasound was normal  DVT prophylaxis: Subcutaneous Lovenox Code Status: Full Family Communication: Father mother and sister in the room Disposition Plan: Home once CPK level comes down to reasonable level  Dr. Barrie FolkUgah [facility] Pager [phone]  09/13/2018, 7:40 PM  LOS: 5 days   Consultants:  None  Procedures:  None  Antimicrobials:  None   Objective: Vitals:  Vitals:   09/13/18 0500 09/13/18 1440  BP: (!) 140/94 138/78  Pulse: 69 60  Resp: 19 20  Temp: 98.3 F (36.8 C) 98.4 F (36.9 C)  SpO2: 100% 99%    Exam:  Constitutional:  . Appears calm and comfortable Eyes:  . pupils and irises appear normal . Normal lids and conjunctivae ENMT:  . grossly normal hearing  . Lips appear normal . external ears, nose appear normal . Oropharynx: mucosa, tongue,posterior pharynx appear normal Neck:  . neck appears normal, no masses, normal ROM, supple . no thyromegaly Respiratory:  . CTA bilaterally, no w/r/r.  . Respiratory effort normal. No retractions or accessory muscle use Cardiovascular:  . RRR, no m/r/g . No LE extremity edema   . Normal pedal pulses Abdomen:  . Abdomen appears normal; no tenderness or masses . No hernias . No HSM Musculoskeletal:  . Digits/nails BUE: no clubbing, cyanosis, petechiae, infection . exam of joints, bones, muscles of at least one  of following: head/neck, RUE, LUE, RLE, LLE   o strength and tone normal, no atrophy, no abnormal movements o No tenderness, masses o Normal ROM, no contractures  . gait and station Skin:  . No rashes, lesions, ulcers . palpation of skin: no induration or nodules Neurologic:  . CN 2-12 intact . Sensation all 4 extremities intact Psychiatric:  . Mental status o Mood, affect appropriate o Orientation to person, place, time  . judgment and  insight appear intact     I have personally reviewed the following:   Data:  EXAM: ABDOMEN ULTRASOUND COMPLETE  COMPARISON:  None.  FINDINGS: Gallbladder: No gallstones or wall thickening visualized. No sonographic Murphy sign noted by sonographer.  Common bile duct: Diameter: 4 mm  Liver: No focal lesion identified. Within normal limits in parenchymal echogenicity. Portal vein is patent on color Doppler imaging with normal direction of blood flow towards the liver.  IVC: No abnormality visualized.  Pancreas: Visualized portion unremarkable.  Spleen: Size and appearance within normal limits.  Right Kidney: Length: 10.5 cm. Echogenicity within normal limits. No mass or hydronephrosis visualized.  Left Kidney: Length: 9.5 Cm, but underestimated due to obliquity of imaging. Echogenicity within normal limits. No mass or hydronephrosis visualized.  Abdominal aorta: No aneurysm visualized.  Other findings: None.  IMPRESSION: Negative abdominal ultrasound.   Electronically Signed   By: Marnee Spring M.D.   On: 09/12/2018 09:02 Results for LOGEN, HEINTZELMAN (MRN [id number]) as of 09/13/2018 08:30  Ref. Range 09/12/2018 05:25  COMPREHENSIVE METABOLIC PANEL Unknown Rpt (A)  Sodium Latest Ref Range: 135 - 145 mmol/L 141  Potassium Latest Ref Range: 3.5 - 5.1 mmol/L 4.0  Chloride Latest Ref Range: 98 - 111 mmol/L 105  CO2 Latest Ref Range: 22 - 32 mmol/L 25  Glucose Latest Ref Range: 70 - 99 mg/dL 98  BUN Latest Ref Range: 6 - 20 mg/dL 7  Creatinine Latest Ref Range: 0.61 - 1.24 mg/dL 9.56  Calcium Latest Ref Range: 8.9 - 10.3 mg/dL 9.2  Anion gap Latest Ref Range: 5 - 15  11  Alkaline Phosphatase Latest Ref Range: 38 - 126 U/L 38  Albumin Latest Ref Range: 3.5 - 5.0 g/dL 3.9  AST Latest Ref Range: 15 - 41 U/L 382 (H)  ALT Latest Ref Range: 0 - 44 U/L 117 (H)  Total Protein Latest Ref Range: 6.5 - 8.1 g/dL 6.7  Total Bilirubin Latest Ref Range: 0.3 - 1.2  mg/dL 1.1  GFR, Est Non African American Latest Ref Range: >60 mL/min >60  GFR, Est African American Latest Ref Range: >60 mL/min >60  CK Total Latest Ref Range: 49 - 397 U/L 4,255 (H)   Scheduled Meds: . amLODipine  10 mg Oral Daily  . enoxaparin (LOVENOX) injection  40 mg Subcutaneous Q24H  . phosphorus  500 mg Oral BID   Continuous Infusions: . sodium chloride 200 mL/hr at 09/13/18 1524    Active Problems:   Rhabdomyolysis   LOS: 5 days

## 2018-09-14 DIAGNOSIS — R945 Abnormal results of liver function studies: Secondary | ICD-10-CM

## 2018-09-14 LAB — HEPATIC FUNCTION PANEL
ALBUMIN: 3.6 g/dL (ref 3.5–5.0)
ALT: 106 U/L — ABNORMAL HIGH (ref 0–44)
AST: 221 U/L — ABNORMAL HIGH (ref 15–41)
Alkaline Phosphatase: 34 U/L — ABNORMAL LOW (ref 38–126)
BILIRUBIN INDIRECT: 0.8 mg/dL (ref 0.3–0.9)
Bilirubin, Direct: 0.1 mg/dL (ref 0.0–0.2)
TOTAL PROTEIN: 6.5 g/dL (ref 6.5–8.1)
Total Bilirubin: 0.9 mg/dL (ref 0.3–1.2)

## 2018-09-14 LAB — CK: CK TOTAL: 8451 U/L — AB (ref 49–397)

## 2018-09-14 LAB — COMPREHENSIVE METABOLIC PANEL
ALK PHOS: 34 U/L — AB (ref 38–126)
ALT: 103 U/L — AB (ref 0–44)
AST: 225 U/L — AB (ref 15–41)
Albumin: 3.6 g/dL (ref 3.5–5.0)
Anion gap: 9 (ref 5–15)
BUN: 7 mg/dL (ref 6–20)
CALCIUM: 8.5 mg/dL — AB (ref 8.9–10.3)
CO2: 23 mmol/L (ref 22–32)
CREATININE: 0.81 mg/dL (ref 0.61–1.24)
Chloride: 106 mmol/L (ref 98–111)
Glucose, Bld: 94 mg/dL (ref 70–99)
Potassium: 3.8 mmol/L (ref 3.5–5.1)
Sodium: 138 mmol/L (ref 135–145)
Total Bilirubin: 1 mg/dL (ref 0.3–1.2)
Total Protein: 6.4 g/dL — ABNORMAL LOW (ref 6.5–8.1)

## 2018-09-14 NOTE — Care Management Note (Signed)
Case Management Note  Patient Details  Name: [name] Ramirez MRN: [id number] Date of Birth: [birth year]  Subjective/Objective:                  From chart: HPI/Recap of past 2324 hours: 32 year old with polysubstance abuse including marijuana and tobacco, HTN who [facility] with complaints of allover body cramps and excessive diaphoresis. Patient was moving boxes at the time of this event. Denies any new medications or cocaine use. Upon presentation CPK elevated and trended up to over 6 thousand. Admitted for rhabdomyolysis.  [id number] CPK Total=8451. Action/Plan: Will follow for progression-rhabdomyolysis  Will follow for cm care. Expected Discharge Date:                  Expected Discharge Plan:  Home/Self Care  In-House Referral:     Discharge planning Services  CM Consult  Post Acute Care Choice:    Choice offered to:     DME Arranged:    DME Agency:     HH Arranged:    HH Agency:     Status of Service:  In process, will continue to follow  If discussed at Long Length of Stay Meetings, dates discussed:    Additional Comments:  Golda AcreDavis, Standley Bargo Lynn, RN 09/14/2018, 11:30 AM

## 2018-09-14 NOTE — Progress Notes (Signed)
PROGRESS NOTE  [name] Ramirez ZHY:[id number] DOB: 07/02/1986 DOA: 09/08/2018 PCP: Patient, No Pcp Per  HPI/Recap of past 7624 hours: 32 year old with polysubstance abuse including marijuana and tobacco, HTN who [facility] with complaints of allover body cramps and excessive diaphoresis.  Patient was moving boxes at the time of this event.  Denies any new medications or cocaine use.  Upon presentation CPK elevated and trended up to over 6 thousand.  Admitted for rhabdomyolysis.  Hospital course complicated by worsening CPK in the setting of aggressive fluid hydration.  09/14/2018: Patient seen and examined at his bedside.  No acute events overnight.  He reports some discomfort on palpation of his limbs otherwise no changes.  He has no new complaints.  CPK still significantly elevated however is trending down.  Repeat CPK in the morning.  Avoid NSAIDs or nephrotoxic agents.  Assessment/Plan: Active Problems:   Rhabdomyolysis  Rhabdomyolysis most likely secondary to strenuous physical activity CPK trending down from 13 K to 8K today Obtain CPK in the morning Avoid nephrotoxic agents Continue IV fluid hydration normal saline at 200 cc/h Repeat CPK in the morning  Elevated AST and ALT Unclear etiology With negative abdominal ultrasound Negative hepatitis viral panel Avoid hepatotoxic agents  Resolved hyperbilirubinemia  Accelerated hypertension Blood pressure still elevated Suspect worsened by aggressive IV fluid hydration On amlodipine 10 mg daily  Transient bradycardia and tachycardia Continue to monitor  Polysubstance abuse including marijuana and tobacco Polysubstance abuse cessation counseling done at bedside Nicotine patch  Risk: High risk for decompensation due to rhabdomyolysis.  Requiring high rate of IV fluid to prevent an acute kidney injury or other complications in the setting of significant rhabdomyolysis.   Code Status: Full code  Family  Communication: None at bedside  Disposition Plan: Home possibly in 1 to 2 days when CPK trended down.   Consultants:  None  Procedures:  None  Antimicrobials:  None  DVT prophylaxis: Subcu Lovenox daily   Objective: Vitals:   09/13/18 1440 09/13/18 2001 09/14/18 0456 09/14/18 1336  BP: 138/78 (!) 136/96 (!) 145/98 (!) 152/96  Pulse: 60 68 (!) 55 60  Resp: 20 18 17 20   Temp: 98.4 F (36.9 C) 98.4 F (36.9 C) 97.7 F (36.5 C) 97.9 F (36.6 C)  TempSrc: Oral Oral Oral   SpO2: 99% 99% 92% 100%  Weight:   103.4 kg   Height:        Intake/Output Summary (Last 24 hours) at 09/14/2018 1518 Last data filed at 09/14/2018 0910 Gross per 24 hour  Intake 2655 ml  Output 1800 ml  Net 855 ml   Filed Weights   09/11/18 1100 09/13/18 0500 09/14/18 0456  Weight: 102.2 kg 100.2 kg 103.4 kg    Exam:  . General: 32 y.o. year-old male Pleasant well-developed well-nourished in no acute distress.  Alert and oriented x3.   . Cardiovascular: Regular rate and rhythm with no rubs or gallops.  No JVD or thyromegaly noted. Marland Kitchen. Respiratory: Clear to auscultation with no wheezes or rales. Good inspiratory effort. . Abdomen: Soft nontender nondistended with normal bowel sounds x4 quadrants. . Musculoskeletal: No lower extremity edema. 2/4 pulses in all 4 extremities. . Skin: No ulcerative lesions noted or rashes, . Psychiatry: Mood is appropriate for condition and setting   Data Reviewed: CBC: Recent Labs  Lab 09/08/18 2021 09/09/18 0627  WBC 11.3* 9.3  NEUTROABS  --  5.2  HGB 16.7 14.1  HCT 46.3 40.7  MCV 75.8* 80.1  PLT 290 239   Basic Metabolic Panel: Recent Labs  Lab 09/08/18 2021 09/09/18 0627  09/10/18 0532 09/11/18 0511 09/12/18 0525 09/13/18 0535 09/13/18 1027 09/14/18 0526  NA 142 140  140   < > 140 142 141 140  --  138  K 2.8* 4.1  4.0   < > 4.0 3.8 4.0 3.6  --  3.8  CL 116* 107  106   < > 108 107 105 104  --  106  CO2 16* 22  23   < > 24 27 25 27   --   23  GLUCOSE 68* 87  92   < > 101* 107* 98 89  --  94  BUN 11 10  10    < > 7 7 7 6   --  7  CREATININE 0.86 1.01  1.05   < > 0.88 0.90 0.73 0.86  --  0.81  CALCIUM 6.6* 8.6*  8.5*   < > 8.2* 8.6* 9.2 8.9  --  8.5*  MG 1.2* 2.3  2.2  --   --   --   --   --  1.7  --   PHOS 1.4* 3.6  --   --   --   --   --  3.2  --    < > = values in this interval not displayed.   GFR: Estimated Creatinine Clearance: 156.6 mL/min (by C-G formula based on SCr of 0.81 mg/dL). Liver Function Tests: Recent Labs  Lab 09/09/18 1345 09/10/18 0532 09/11/18 0511 09/12/18 0525 09/13/18 1027 09/14/18 0526  AST 281* 305* 348* 382*  --  225*  221*  ALT 63* 75* 97* 117*  --  103*  106*  ALKPHOS 38 31* 35* 38 40 34*  34*  BILITOT 1.6* 1.4* 1.2 1.1  --  1.0  0.9  PROT 6.4* 5.9* 6.1* 6.7  --  6.4*  6.5  ALBUMIN 3.6 3.2* 3.3* 3.9  --  3.6  3.6   Recent Labs  Lab 09/08/18 2021  LIPASE 21   No results for input(s): AMMONIA in the last 168 hours. Coagulation Profile: No results for input(s): INR, PROTIME in the last 168 hours. Cardiac Enzymes: Recent Labs  Lab 09/10/18 0532 09/11/18 0511 09/12/18 0525 09/13/18 0535 09/14/18 0526  CKTOTAL 18,788* 21,885* 4,255* 13,242* 8,451*   BNP (last 3 results) No results for input(s): PROBNP in the last 8760 hours. HbA1C: No results for input(s): HGBA1C in the last 72 hours. CBG: Recent Labs  Lab 09/08/18 2158 09/09/18 0741 09/09/18 1157 09/09/18 1609  GLUCAP 193* 89 103* 115*   Lipid Profile: No results for input(s): CHOL, HDL, LDLCALC, TRIG, CHOLHDL, LDLDIRECT in the last 72 hours. Thyroid Function Tests: No results for input(s): TSH, T4TOTAL, FREET4, T3FREE, THYROIDAB in the last 72 hours. Anemia Panel: No results for input(s): VITAMINB12, FOLATE, FERRITIN, TIBC, IRON, RETICCTPCT in the last 72 hours. Urine analysis:    Component Value Date/Time   COLORURINE YELLOW 09/08/2018 2251   APPEARANCEUR CLEAR 09/08/2018 2251   LABSPEC 1.010  09/08/2018 2251   PHURINE 6.0 09/08/2018 2251   GLUCOSEU 250 (A) 09/08/2018 2251   HGBUR TRACE (A) 09/08/2018 2251   BILIRUBINUR NEGATIVE 09/08/2018 2251   KETONESUR 40 (A) 09/08/2018 2251   PROTEINUR NEGATIVE 09/08/2018 2251   UROBILINOGEN 0.2 07/23/2012 0518   NITRITE NEGATIVE 09/08/2018 2251   LEUKOCYTESUR NEGATIVE 09/08/2018 2251   Sepsis Labs: @LABRCNTIP (procalcitonin:4,lacticidven:4)  )No results found for this or any previous visit (from the past 240  hour(s)).    Studies: No results found.  Scheduled Meds: . amLODipine  10 mg Oral Daily  . enoxaparin (LOVENOX) injection  40 mg Subcutaneous Q24H  . phosphorus  500 mg Oral BID    Continuous Infusions: . sodium chloride 200 mL/hr at 09/14/18 1432     LOS: 6 days     Darlin Drop, MD [facility] Pager [phone]  If 7PM-7AM, please contact night-coverage [email or web address] Password [facility] 09/14/2018, 3:18 PM

## 2018-09-15 LAB — BASIC METABOLIC PANEL
Anion gap: 8 (ref 5–15)
BUN: 8 mg/dL (ref 6–20)
CHLORIDE: 109 mmol/L (ref 98–111)
CO2: 25 mmol/L (ref 22–32)
CREATININE: 0.86 mg/dL (ref 0.61–1.24)
Calcium: 8.7 mg/dL — ABNORMAL LOW (ref 8.9–10.3)
GFR calc Af Amer: >60 mL/min (ref >60)
GFR calc non Af Amer: >60 mL/min (ref >60)
GLUCOSE: 101 mg/dL — AB (ref 70–99)
Potassium: 4 mmol/L (ref 3.5–5.1)
SODIUM: 142 mmol/L (ref 135–145)

## 2018-09-15 LAB — CK
Total CK: 3750 U/L — ABNORMAL HIGH (ref 49–397)
Total CK: 3515 U/L — ABNORMAL HIGH (ref 49–397)

## 2018-09-15 LAB — HEPATITIS C ANTIBODY: HCV Ab: <0.1 s/co ratio (ref 0.0–0.9)

## 2018-09-15 LAB — HEPATITIS B CORE ANTIBODY, IGM: HEP B C IGM: NEGATIVE

## 2018-09-15 LAB — MAGNESIUM: Magnesium: 1.7 mg/dL (ref 1.7–2.4)

## 2018-09-15 MED ORDER — MAGNESIUM SULFATE 4 GM/100ML IV SOLN
4.0000 g | Freq: Once | INTRAVENOUS | Status: AC
  Administered 2018-09-15: 4 g via INTRAVENOUS
  Filled 2018-09-15: qty 100

## 2018-09-15 MED ORDER — AMLODIPINE BESYLATE 10 MG PO TABS: 10.0000 mg | ORAL_TABLET | Freq: Every day | ORAL | 0 refills | Status: DC

## 2018-09-15 NOTE — Discharge Summary (Addendum)
Discharge Summary  [name] Ramirez ZOX:[id number] DOB: Nov 10, 1986  PCP: Patient, No Pcp Per  Admit date: 09/08/2018 Discharge date: 09/15/2018  Time spent: 25 minutes   UPDATE: Patient is anxious about leaving and wants to leave AMA if not discharged. Patient advised to drink lots of fluid and follow up with PCP on Thursday to recheck his cpk.  Recommendations for Outpatient Follow-up:  1. Follow-up with your PCP within a week 2. Take your medications as prescribed 3. Repeat CPK on Thursday, 09/17/2018 4. Avoid NSAIDs and dehydration 5. Avoid strenous movements or exercises  Discharge Diagnoses:  Active Hospital Problems   Diagnosis Date Noted  . Rhabdomyolysis 09/08/2018    Resolved Hospital Problems  No resolved problems to display.    Discharge Condition: Stable  Diet recommendation: Resume previous diet  Vitals:   09/15/18 0432 09/15/18 1522  BP: 137/84   Pulse: 63   Resp: 18   Temp: 97.7 F (36.5 C)   SpO2: 100% 97%    History of present illness:   32 year old with polysubstance abuse including marijuana and tobacco, HTN who presented to the ED at [facility] with complaints of allover body cramps and excessive diaphoresis.  Patient was moving boxes at the time of this event.  Denies any new medications or cocaine use.  Upon presentation CPK elevated and trended up to over 6 thousand.  Admitted for rhabdomyolysis.  Hospital course complicated by worsening CPK in the setting of aggressive fluid hydration.  09/14/2018: CPK trending down however CPK still significantly elevated.   09/15/2018: Patient seen and examined at his bedside.  No acute events overnight.  He has no new complaints.  CPK trending down.  On the day of discharge, the patient was hemodynamically stable.  He will need to follow-up with his primary care provider and repeat CPK on Thursday, 09/17/2018.   Hospital Course:  Active Problems:   Rhabdomyolysis  Rhabdomyolysis most likely secondary to  strenuous physical activity CPK trending down from 13 K to 8K to 3K today Repeat CPK at 1500 on 09/15/2018 Post discharge repeat CPK on 09/17/2018 Avoid nephrotoxic agents Avoid dehydration and continue high fluid intake  Elevated AST and ALT Unclear etiology With negative abdominal ultrasound Negative hepatitis viral panel Avoid hepatotoxic agents Follow-up with PCP to monitor liver enzymes  Resolved hyperbilirubinemia  Essential hypertension Blood pressures well controlled after increasing amlodipine to 10 mg daily  Transient bradycardia and tachycardia Continue to monitor  Polysubstance abuse including marijuana and tobacco Polysubstance abuse cessation counseling done at bedside Nicotine patch   Code Status: Full code   Consultants:  None  Procedures:  None  Antimicrobials:  None   Discharge Exam: BP 137/84   Pulse 63   Temp 97.7 F (36.5 C)   Resp 18   Ht 5\' 9"  (1.753 m)   Wt 103.4 kg   SpO2 97%   BMI 33.67 kg/m  . General: 32 y.o. year-old male well developed well nourished in no acute distress.  Alert and oriented x3. . Cardiovascular: Regular rate and rhythm with no rubs or gallops.  No thyromegaly or JVD noted.   Marland Kitchen Respiratory: Clear to auscultation with no wheezes or rales. Good inspiratory effort. . Abdomen: Soft nontender nondistended with normal bowel sounds x4 quadrants. . Musculoskeletal: No lower extremity edema. 2/4 pulses in all 4 extremities. . Skin: No ulcerative lesions noted or rashes, . Psychiatry: Mood is appropriate for condition and setting  Discharge Instructions You were cared for by a hospitalist during your hospital stay.  If you have any questions about your discharge medications or the care you received while you were in the hospital after you are discharged, you can call the unit and asked to speak with the hospitalist on call if the hospitalist that took care of you is not available. Once you are discharged,  your primary care physician will handle any further medical issues. Please note that NO REFILLS for any discharge medications will be authorized once you are discharged, as it is imperative that you return to your primary care physician (or establish a relationship with a primary care physician if you do not have one) for your aftercare needs so that they can reassess your need for medications and monitor your lab values.   Allergies as of 09/15/2018      Reactions   Advil [ibuprofen] Other (See Comments)   Feels funny after taking      Medication List    TAKE these medications   amLODipine 10 MG tablet Commonly known as:  NORVASC Take 1 tablet (10 mg total) by mouth daily. Start taking on:  09/16/2018      Allergies  Allergen Reactions  . Advil [Ibuprofen] Other (See Comments)    Feels funny after taking   Follow-up Information    [facility]. Call in 2 day(s).   Why:  Please call for follow-up appointment. please see thrusday or friday for lab work CK  Contact information: [address] [place] [address] [phone]           The results of significant diagnostics from this hospitalization (including imaging, microbiology, ancillary and laboratory) are listed below for reference.    Significant Diagnostic Studies: US Abdomen Complete  Result Date: 09/12/2018 CLINICAL DATA:  Elevated liver function tests EXAM: ABDOMEN ULTRASOUND COMPLETE COMPARISON:  None. FINDINGS: Gallbladder: No gallstones or wall thickening visualized. No sonographic Murphy sign noted by sonographer. Common bile duct: Diameter: 4 mm Liver: No focal lesion identified. Within normal limits in parenchymal echogenicity. Portal vein is patent on color Doppler imaging with normal direction of blood flow towards the liver. IVC: No abnormality visualized. Pancreas: Visualized portion unremarkable. Spleen: Size and appearance within normal limits. Right  Kidney: Length: 10.5 cm. Echogenicity within normal limits. No mass or hydronephrosis visualized. Left Kidney: Length: 9.5 Cm, but underestimated due to obliquity of imaging. Echogenicity within normal limits. No mass or hydronephrosis visualized. Abdominal aorta: No aneurysm visualized. Other findings: None. IMPRESSION: Negative abdominal ultrasound. Electronically Signed   By: Marnee Spring M.D.   On: 09/12/2018 09:02    Microbiology: No results found for this or any previous visit (from the past 240 hour(s)).   Labs: Basic Metabolic Panel: Recent Labs  Lab 09/08/18 2021 09/09/18 0627  09/11/18 0511 09/12/18 0525 09/13/18 0535 09/13/18 1027 09/14/18 0526 09/15/18 0537  NA 142 140  140   < > 142 141 140  --  138 142  K 2.8* 4.1  4.0   < > 3.8 4.0 3.6  --  3.8 4.0  CL 116* 107  106   < > 107 105 104  --  106 109  CO2 16* 22  23   < > 27 25 27   --  23 25  GLUCOSE 68* 87  92   < > 107* 98 89  --  94 101*  BUN 11 10  10    < > 7 7 6   --  7 8  CREATININE 0.86 1.01  1.05   < >  0.90 0.73 0.86  --  0.81 0.86  CALCIUM 6.6* 8.6*  8.5*   < > 8.6* 9.2 8.9  --  8.5* 8.7*  MG 1.2* 2.3  2.2  --   --   --   --  1.7  --  1.7  PHOS 1.4* 3.6  --   --   --   --  3.2  --   --    < > = values in this interval not displayed.   Liver Function Tests: Recent Labs  Lab 09/09/18 1345 09/10/18 0532 09/11/18 0511 09/12/18 0525 09/13/18 1027 09/14/18 0526  AST 281* 305* 348* 382*  --  225*  221*  ALT 63* 75* 97* 117*  --  103*  106*  ALKPHOS 38 31* 35* 38 40 34*  34*  BILITOT 1.6* 1.4* 1.2 1.1  --  1.0  0.9  PROT 6.4* 5.9* 6.1* 6.7  --  6.4*  6.5  ALBUMIN 3.6 3.2* 3.3* 3.9  --  3.6  3.6   Recent Labs  Lab 09/08/18 2021  LIPASE 21   No results for input(s): AMMONIA in the last 168 hours. CBC: Recent Labs  Lab 09/08/18 2021 09/09/18 0627  WBC 11.3* 9.3  NEUTROABS  --  5.2  HGB 16.7 14.1  HCT 46.3 40.7  MCV 75.8* 80.1  PLT 290 239   Cardiac Enzymes: Recent Labs  Lab  09/12/18 0525 09/13/18 0535 09/14/18 0526 09/15/18 0537 09/15/18 1452  CKTOTAL 4,255* 13,242* 8,451* 3,750* 3,515*   BNP: BNP (last 3 results) No results for input(s): BNP in the last 8760 hours.  ProBNP (last 3 results) No results for input(s): PROBNP in the last 8760 hours.  CBG: Recent Labs  Lab 09/08/18 2158 09/09/18 0741 09/09/18 1157 09/09/18 1609  GLUCAP 193* 89 103* 115*       Signed:  Darlin Droparole N Brittany Amirault, MD [facility] 09/15/2018, 5:03 PM

## 2018-09-15 NOTE — Discharge Instructions (Signed)
Rhabdomyolysis Rhabdomyolysis is a condition that happens when muscle cells break down and release substances into the blood that can damage the kidneys. This condition happens because of damage to the muscles that move bones (skeletal muscle). When the skeletal muscles are damaged, substances inside the muscle cells go into the blood. One of these substances is a protein called myoglobin. Large amounts of myoglobin can cause kidney damage or kidney failure. Other substances that are released by muscle cells may upset the balance of the minerals (electrolytes) in your blood. This imbalance causes your blood to have too much acid (acidosis). What are the causes? This condition is caused by muscle damage. Muscle damage often happens because of:  Using your muscles too much.  An injury that crushes or squeezes a muscle too tightly.  Using illegal drugs, mainly cocaine.  Alcohol abuse.  Other possible causes include:  Prescription medicines, such as those that: ? Lower cholesterol (statins). ? Treat ADHD (attention deficit hyperactivity disorder) or help with weight loss (amphetamines). ? Treat pain (opiates).  Infections.  Muscle diseases that are passed down from parent to child (inherited).  High fever.  Heatstroke.  Not having enough fluids in your body (dehydration).  Seizures.  Surgery.  What increases the risk? This condition is more likely to develop in people who:  Have a family history of muscle disease.  Take part in extreme sports, such as running in marathons.  Have diabetes.  Are older.  Abuse drugs or alcohol.  What are the signs or symptoms? Symptoms of this condition vary. Some people have very few symptoms, and other people have many symptoms. The most common symptoms include:  Muscle pain and swelling.  Weak muscles.  Dark urine.  Feeling weak and tired.  Other symptoms include:  Nausea and vomiting.  Fever.  Pain in the abdomen.  Pain  in the joints.  Symptoms of complications from this condition include:  Heart rhythm that is not normal (arrhythmia).  Seizures.  Not urinating enough because of kidney failure.  Very low blood pressure (shock). Signs of shock include dizziness, blurry vision, and clammy skin.  Bleeding that is hard to stop or control.  How is this diagnosed? This condition is diagnosed based on your medical history, your symptoms, and a physical exam. Tests may also be done, including:  Blood tests.  Urine tests to check for myoglobin.  You may also have other tests to check for causes of muscle damage and to check for complications. How is this treated? Treatment for this condition helps to:  Make sure you have enough fluids in your body.  Lower the acid levels in your blood to reverse acidosis.  Protect your kidneys.  Treatment may include:  Fluids and medicines given through an IV tube that is inserted into one of your veins.  Medicines to lower acidosis or to bring back the balance of the minerals in your body.  Hemodialysis. This treatment uses an artificial kidney machine to filter your blood while you recover. You may have this if other treatments are not helping.  Follow these instructions at home:  Take over-the-counter and prescription medicines only as told by your health care provider.  Rest at home until your health care provider says that you can return to your normal activities.  Drink enough fluid to keep your urine clear or pale yellow.  Do not do activities that take a lot of effort (are strenuous). Ask your health care provider what level of exercise is safe   for you.  Do not abuse drugs or alcohol. If you are having problems with drug or alcohol use, ask your health care provider for help.  Keep all follow-up visits as told by your health care provider. This is important. Contact a health care provider if:  You start having symptoms of this condition after  treatment. Get help right away if:  You have a seizure.  You bleed easily or cannot control bleeding.  You cannot urinate.  You have chest pain.  You have trouble breathing. This information is not intended to replace advice given to you by your health care provider. Make sure you discuss any questions you have with your health care provider. Document Released: 11/28/2004 Document Revised: 09/27/2016 Document Reviewed: 09/27/2016 Elsevier Interactive Patient Education  2018 Elsevier Inc.  

## 2018-09-15 NOTE — Care Management (Signed)
[id number]/Rhonda Davis,BSN,RN3,CCM [phone]/discharged to home/ no cm needs present.

## 2018-09-29 NOTE — Progress Notes (Signed)
Patient ID: [name] Ramirez, male   DOB: [birth year], 32 y.o.   MRN: [id number]     [name] Ramirez, is a 32 y.o. male  MVH:[id number]  [id number]:[id number]  DOB - [birth year]  Subjective:  Chief Complaint and HPI: [name] Ramirez is a 32 y.o. male here today to establish care and for a follow up visit After being hospitalized 9/10-9/17/2019 for rhabdomyolysis.  He is feeling good overall.   Fatigues a little more easily than normal.  Slight body aches with activity. No fever.  No N/V/D.  Denies cocaine use in 2 years.  Doesn't feel alcohol is a problem for him.  +marijuana use   From discharge summary: History of present illness:   32 year old with polysubstance abuse including marijuana and tobacco, HTN who presented to the ED at [facility] with complaints of allover body cramps and excessive diaphoresis. Patient was moving boxes at the time of this event. Denies any new medications or cocaine use. Upon presentation CPK elevated and trended up to over 6 thousand. Admitted for rhabdomyolysis.  Hospital course complicated by worsening CPK in the setting of aggressive fluid hydration.  09/14/2018: CPK trending down however CPK still significantly elevated.   09/15/2018: Patient seen and examined at his bedside.  No acute events overnight.  He has no new complaints.  CPK trending down.  On the day of discharge, the patient was hemodynamically stable.  He will need to follow-up with his primary care provider and repeat CPK on Thursday, 09/17/2018.   Hospital Course:  Active Problems:   Rhabdomyolysis  Rhabdomyolysis most likely secondary to strenuous physical activity CPK trending down from 13 K to 8K to 3K today Repeat CPK at 1500 on 09/15/2018 Post discharge repeat CPK on 09/17/2018 Avoid nephrotoxic agents Avoid dehydration and continue high fluid intake  Elevated AST and ALT Unclear etiology With negative abdominal ultrasound Negative hepatitis viral panel Avoid hepatotoxic  agents Follow-up with PCP to monitor liver enzymes  Resolved hyperbilirubinemia  Essential hypertension Blood pressures well controlled after increasing amlodipine to 10 mg daily  Transient bradycardia and tachycardia Continue to monitor  Polysubstance abuse including marijuana and tobacco Polysubstance abuse cessation counseling done at bedside Nicotine patch  ED/Hospital notes reviewed.   Social History:  Gf, works for moving company  ROS:   Constitutional:  No f/c, No night sweats, No unexplained weight loss. EENT:  No vision changes, No blurry vision, No hearing changes. No mouth, throat, or ear problems.  Respiratory: No cough, No SOB Cardiac: No CP, no palpitations GI:  No abd pain, No N/V/D. GU: No Urinary s/sx Musculoskeletal: No joint pain Neuro: No headache, no dizziness, no motor weakness.  Skin: No rash Endocrine:  No polydipsia. No polyuria.  Psych: Denies SI/HI  No problems updated.  ALLERGIES: Allergies  Allergen Reactions  . Advil [Ibuprofen] Other (See Comments)    Feels funny after taking    PAST MEDICAL HISTORY: Past Medical History:  Diagnosis Date  . Hypertension     MEDICATIONS AT HOME: Prior to Admission medications   Medication Sig Start Date End Date Taking? Authorizing Provider  amLODipine (NORVASC) 10 MG tablet Take 1 tablet (10 mg total) by mouth daily. 09/30/18  Yes Anders Simmonds, PA-C     Objective:  EXAM:   Vitals:   09/30/18 1104  BP: 120/80  Pulse: 61  Resp: 18  Temp: 97.9 F (36.6 C)  TempSrc: Oral  SpO2: 95%  Weight: 237 lb 3.2 oz (107.6 kg)  Height: 5\' 9"  (1.753 m)  General appearance : A&OX3. NAD. Non-toxic-appearing HEENT: Atraumatic and Normocephalic.  PERRLA. EOM intact.   Neck: supple, no JVD. No cervical lymphadenopathy. No thyromegaly Chest/Lungs:  Breathing-non-labored, Good air entry bilaterally, breath sounds normal without rales, rhonchi, or wheezing  CVS: S1 S2 regular, no murmurs,  gallops, rubs  Extremities: Bilateral Lower Ext shows no edema, both legs are warm to touch with = pulse throughout Neurology:  CN II-XII grossly intact, Non focal.   Psych:  TP linear. J/I WNL. Normal speech. Appropriate eye contact and affect.  Skin:  No Rash  Data Review No results found for: HGBA1C   Assessment & Plan   1. Non-traumatic rhabdomyolysis Improving by symptoms - Comprehensive metabolic panel - CK  2. Elevated LFTs No alcohol.  Drink 100 ounces water daily - Comprehensive metabolic panel  3. Hypertension, unspecified type Controlled-continue current regimen - amLODipine (NORVASC) 10 MG tablet; Take 1 tablet (10 mg total) by mouth daily.  Dispense: 90 tablet; Refill: 1  4. Hospital discharge follow-up Much improved.       Patient have been counseled extensively about nutrition and exercise  Return in about 2 months (around 11/30/2018) for assign PCP; f/up htn.  The patient was given clear instructions to go to ER or return to medical center if symptoms don't improve, worsen or new problems develop. The patient verbalized understanding. The patient was told to call to get lab results if they haven't heard anything in the next week.     Georgian Co, PA-C [facility] [place], [place] [phone]   09/30/2018, 11:44 AM

## 2018-09-30 ENCOUNTER — Other Ambulatory Visit: Payer: Self-pay

## 2018-09-30 ENCOUNTER — Ambulatory Visit: Payer: Self-pay | Attending: Family Medicine | Admitting: Physician Assistant

## 2018-09-30 VITALS — BP 120/80 | HR 61 | Temp 97.9°F | Resp 18 | Ht 69.0 in | Wt 237.2 lb

## 2018-09-30 DIAGNOSIS — R7989 Other specified abnormal findings of blood chemistry: Secondary | ICD-10-CM

## 2018-09-30 DIAGNOSIS — Z886 Allergy status to analgesic agent status: Secondary | ICD-10-CM | POA: Insufficient documentation

## 2018-09-30 DIAGNOSIS — F1210 Cannabis abuse, uncomplicated: Secondary | ICD-10-CM | POA: Insufficient documentation

## 2018-09-30 DIAGNOSIS — I10 Essential (primary) hypertension: Secondary | ICD-10-CM

## 2018-09-30 DIAGNOSIS — F17200 Nicotine dependence, unspecified, uncomplicated: Secondary | ICD-10-CM | POA: Insufficient documentation

## 2018-09-30 DIAGNOSIS — Z79899 Other long term (current) drug therapy: Secondary | ICD-10-CM | POA: Insufficient documentation

## 2018-09-30 DIAGNOSIS — M6282 Rhabdomyolysis: Secondary | ICD-10-CM

## 2018-09-30 DIAGNOSIS — R945 Abnormal results of liver function studies: Secondary | ICD-10-CM

## 2018-09-30 DIAGNOSIS — Z09 Encounter for follow-up examination after completed treatment for conditions other than malignant neoplasm: Secondary | ICD-10-CM

## 2018-09-30 MED ORDER — AMLODIPINE BESYLATE 10 MG PO TABS: 10.0000 mg | ORAL_TABLET | Freq: Every day | ORAL | 1 refills | Status: DC

## 2018-09-30 NOTE — Patient Instructions (Signed)
Drink 100 ounces water daily.  Do not drink alcohol or use other substances.

## 2018-10-01 LAB — COMPREHENSIVE METABOLIC PANEL
ALT: 19 IU/L (ref 0–44)
AST: 15 IU/L (ref 0–40)
Albumin/Globulin Ratio: 2 (ref 1.2–2.2)
Albumin: 4.3 g/dL (ref 3.5–5.5)
Alkaline Phosphatase: 47 IU/L (ref 39–117)
BUN/Creatinine Ratio: 10 (ref 9–20)
BUN: 10 mg/dL (ref 6–20)
Bilirubin Total: 0.7 mg/dL (ref 0.0–1.2)
CALCIUM: 9.4 mg/dL (ref 8.7–10.2)
CHLORIDE: 105 mmol/L (ref 96–106)
CO2: 24 mmol/L (ref 20–29)
Creatinine, Ser: 0.96 mg/dL (ref 0.76–1.27)
GFR calc Af Amer: 121 mL/min/{1.73_m2} (ref >59)
GFR, EST NON AFRICAN AMERICAN: 105 mL/min/{1.73_m2} (ref >59)
GLUCOSE: 85 mg/dL (ref 65–99)
Globulin, Total: 2.1 g/dL (ref 1.5–4.5)
Potassium: 4.3 mmol/L (ref 3.5–5.2)
Sodium: 141 mmol/L (ref 134–144)
TOTAL PROTEIN: 6.4 g/dL (ref 6.0–8.5)

## 2018-10-01 LAB — CK: Total CK: 236 U/L — ABNORMAL HIGH (ref 24–204)

## 2018-10-02 ENCOUNTER — Telehealth: Payer: Self-pay

## 2018-10-02 NOTE — Telephone Encounter (Signed)
-----   Message from Anders Simmonds, [place] sent at 10/01/2018  9:00 AM EDT ----- Please call patient.  Labs almost completely back to normal! Liver function has returned to normal; muscle enzyme almost back to normal.   Remain off alcohol.  Continue to drink a lot of water and rest over the next few days.  It is fine for him to go back to work Monday as planned.  Follow-up as planned.  Thanks, Georgian Co, PA-C

## 2018-10-02 NOTE — Telephone Encounter (Signed)
Patient was called, no answer, lvm for the patient to return call.

## 2018-11-30 ENCOUNTER — Ambulatory Visit: Payer: Self-pay | Admitting: Internal Medicine

## 2018-12-30 ENCOUNTER — Other Ambulatory Visit: Payer: Self-pay

## 2018-12-30 ENCOUNTER — Emergency Department (HOSPITAL_BASED_OUTPATIENT_CLINIC_OR_DEPARTMENT_OTHER)
Admission: EM | Admit: 2018-12-30 | Discharge: 2018-12-30 | Disposition: A | Discharge status: Home / Self Care | Payer: Self-pay | Attending: Emergency Medicine | Admitting: Emergency Medicine

## 2018-12-30 ENCOUNTER — Encounter (HOSPITAL_BASED_OUTPATIENT_CLINIC_OR_DEPARTMENT_OTHER): Payer: Self-pay

## 2018-12-30 DIAGNOSIS — I10 Essential (primary) hypertension: Secondary | ICD-10-CM | POA: Insufficient documentation

## 2018-12-30 DIAGNOSIS — S61012A Laceration without foreign body of left thumb without damage to nail, initial encounter: Secondary | ICD-10-CM | POA: Insufficient documentation

## 2018-12-30 DIAGNOSIS — Y999 Unspecified external cause status: Secondary | ICD-10-CM | POA: Insufficient documentation

## 2018-12-30 DIAGNOSIS — Y9289 Other specified places as the place of occurrence of the external cause: Secondary | ICD-10-CM | POA: Insufficient documentation

## 2018-12-30 DIAGNOSIS — Y93G1 Activity, food preparation and clean up: Secondary | ICD-10-CM | POA: Insufficient documentation

## 2018-12-30 DIAGNOSIS — W260XXA Contact with knife, initial encounter: Secondary | ICD-10-CM | POA: Insufficient documentation

## 2018-12-30 DIAGNOSIS — F17210 Nicotine dependence, cigarettes, uncomplicated: Secondary | ICD-10-CM | POA: Insufficient documentation

## 2018-12-30 NOTE — ED Provider Notes (Signed)
[facility] EMERGENCY DEPARTMENT Provider Note   CSN: [id number] Arrival date & time: 12/30/18  1225     History   Chief Complaint Chief Complaint  Patient presents with  . Finger Injury    HPI [name] Ramirez is a 33 y.o. male.  The history is provided by the patient. No language interpreter was used.    33 year old male presenting for evaluation of finger injury.  Patient report before lunch which is approximately 4 hours ago he was preparing for lunch cutting vegetables when he accidentally cut into his left thumb.  He report acute onset of sharp pain moderate in severity, nonradiating with associated bleeding.  Bleeding is since stopped.  He is up-to-date with tetanus.  He is right-hand dominant.  He denies any other injury.  He denies any numbness.   Past Medical History:  Diagnosis Date  . Hypertension     Patient Active Problem List   Diagnosis Date Noted  . Rhabdomyolysis 09/08/2018    History reviewed. No pertinent surgical history.      Home Medications    Prior to Admission medications   Medication Sig Start Date End Date Taking? Authorizing Provider  amLODipine (NORVASC) 10 MG tablet Take 1 tablet (10 mg total) by mouth daily. 09/30/18   Anders Simmonds, PA-C    Family History No family history on file.  Social History Social History   Tobacco Use  . Smoking status: Current Every Day Smoker    Types: Cigarettes  . Smokeless tobacco: Never Used  Substance Use Topics  . Alcohol use: Yes    Comment: occ  . Drug use: Yes    Types: Marijuana     Allergies   Advil [ibuprofen]   Review of Systems Review of Systems  Constitutional: Negative for fever.  Skin: Positive for wound.  Neurological: Negative for numbness.     Physical Exam Updated Vital Signs BP 118/75 (BP Location: Right Arm)   Pulse 73   Temp 99 F (37.2 C) (Oral)   Resp 16   Ht 5\' 9"  (1.753 m)   Wt 103.4 kg   SpO2 100%   BMI 33.67 kg/m   Physical  Exam Vitals signs and nursing note reviewed.  Constitutional:      General: He is not in acute distress.    Appearance: He is well-developed.  HENT:     Head: Atraumatic.  Eyes:     Conjunctiva/sclera: Conjunctivae normal.  Neck:     Musculoskeletal: Neck supple.  Musculoskeletal:        General: Signs of injury (Left thumb: There is a less than 1 cm superficial skin flap laceration noted to the medial tip of finger without any nail involvement and not actively bleeding.  Sensation is intact distally.) present.  Skin:    Findings: No rash.  Neurological:     Mental Status: He is alert.      ED Treatments / Results  Labs (all labs ordered are listed, but only abnormal results are displayed) Labs Reviewed - No data to display  EKG None  Radiology No results found.  Procedures .Marland KitchenLaceration Repair Date/Time: 12/30/2018 2:23 PM Performed by: Fayrene Helper, PA-C Authorized by: Fayrene Helper, PA-C   Consent:    Consent obtained:  Verbal   Consent given by:  Patient   Risks discussed:  Infection, need for additional repair, pain, poor cosmetic result and poor wound healing   Alternatives discussed:  No treatment and delayed treatment Universal protocol:    Procedure  explained and questions answered to patient or proxy's satisfaction: yes     Relevant documents present and verified: yes     Test results available and properly labeled: yes     Imaging studies available: yes     Required blood products, implants, devices, and special equipment available: yes     Site/side marked: yes     Immediately prior to procedure, a time out was called: yes     Patient identity confirmed:  Verbally with patient Anesthesia (see MAR for exact dosages):    Anesthesia method:  None Laceration details:    Location:  Finger   Finger location:  L thumb   Length (cm):  0.5   Depth (mm):  2 Repair type:    Repair type:  Simple Treatment:    Area cleansed with:  Betadine Skin repair:     Repair method:  Tissue adhesive Approximation:    Approximation:  Close Post-procedure details:    Dressing:  Open (no dressing)   Patient tolerance of procedure:  Tolerated well, no immediate complications   (including critical care time)  Medications Ordered in ED Medications - No data to display   Initial Impression / Assessment and Plan / ED Course  I have reviewed the triage vital signs and the nursing notes.  Pertinent labs & imaging results that were available during my care of the patient were reviewed by me and considered in my medical decision making (see chart for details).     BP 118/75 (BP Location: Right Arm)   Pulse 73   Temp 99 F (37.2 C) (Oral)   Resp 16   Ht 5\' 9"  (1.753 m)   Wt 103.4 kg   SpO2 100%   BMI 33.67 kg/m    Final Clinical Impressions(s) / ED Diagnoses   Final diagnoses:  Laceration of left thumb without foreign body without damage to nail, initial encounter    ED Discharge Orders    None     2:05 PM Lac to L thumb without nail involvement.  Wound amenable to dermabond skin repair.     Fayrene Helperran, Hamid Brookens, PA-C 12/30/18 1425    Alvira MondaySchlossman, Erin, MD 01/02/19 1029

## 2018-12-30 NOTE — ED Triage Notes (Signed)
Pt states he cut left thumb tip with kitchen knife-semicircular lac noted-no bleeding-dsg applied in triage-NAD-steady gait

## 2018-12-30 NOTE — ED Notes (Signed)
No active bleeding 

## 2019-08-07 IMAGING — US US ABDOMEN COMPLETE
1 series · 14 of 25 positions shown · non-contrast
Comparison: None.

CLINICAL DATA: Elevated liver function tests

EXAM:
ABDOMEN ULTRASOUND COMPLETE

[Series 1: us abdomen complete · 14 of 150 slices shown]
[im 1/150]
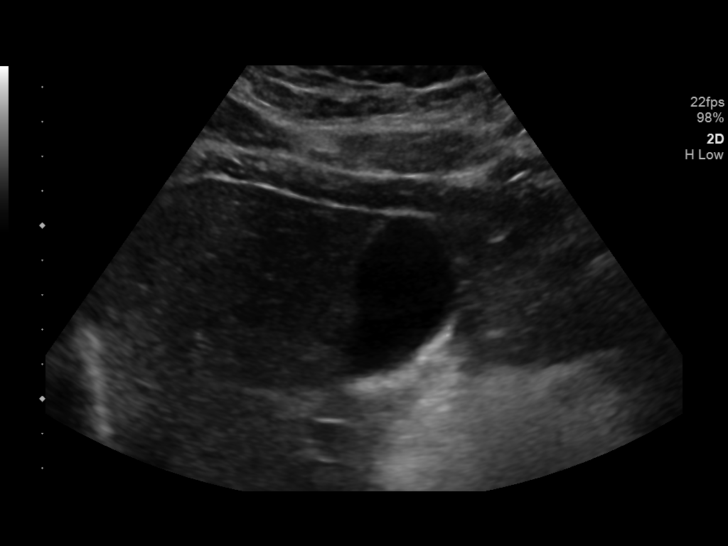
[im 13/150]
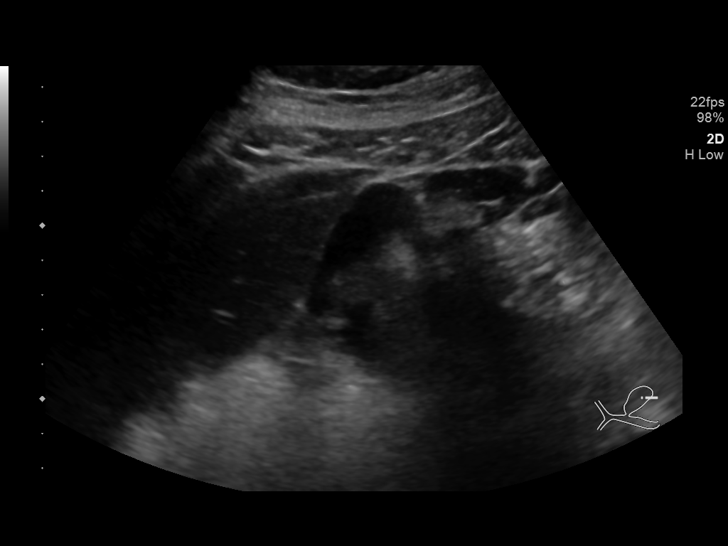
[im 25/150]
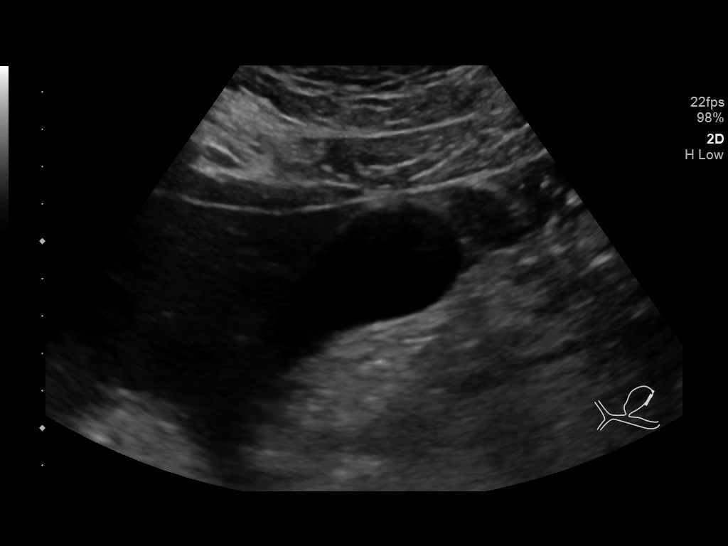
[im 38/150]
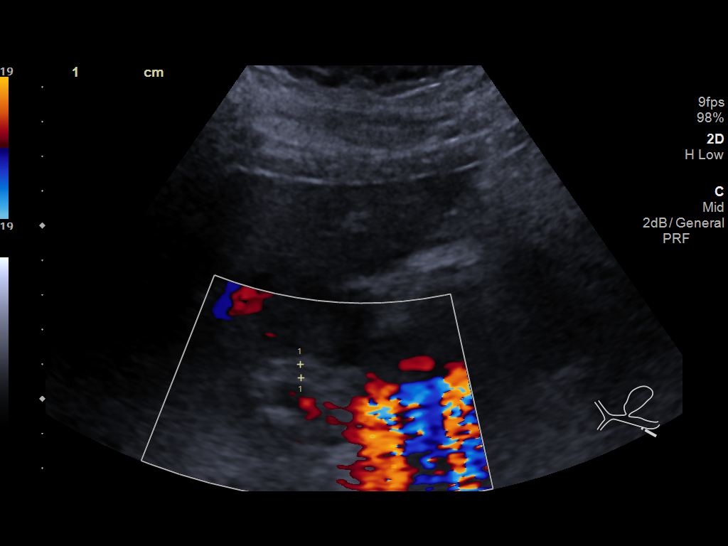
[im 50/150]
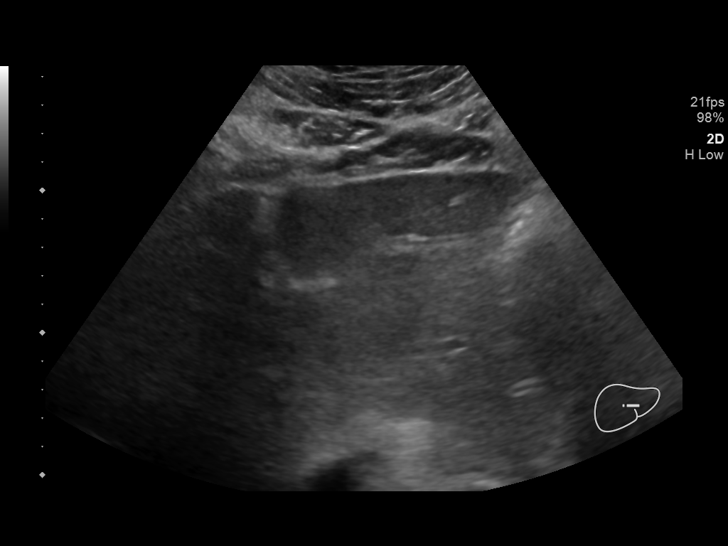
[im 56/150]
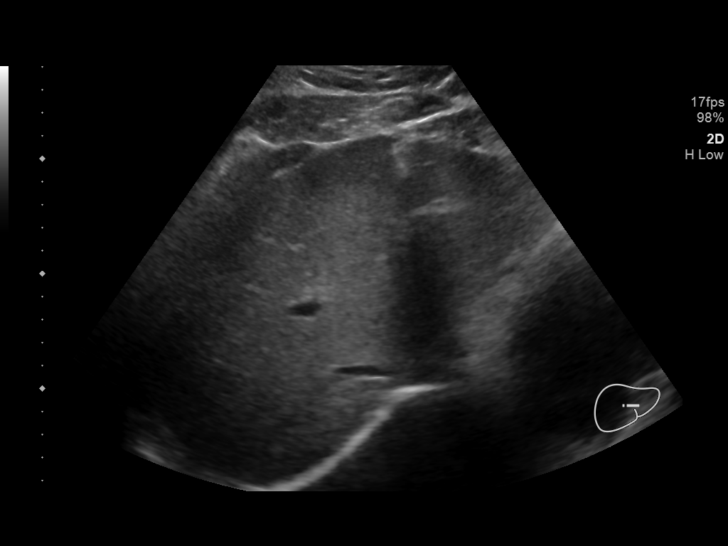
[im 69/150]
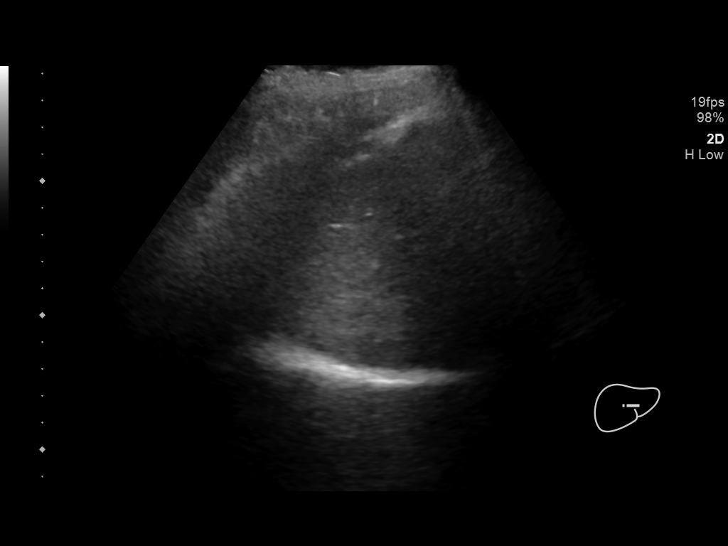
[im 81/150]
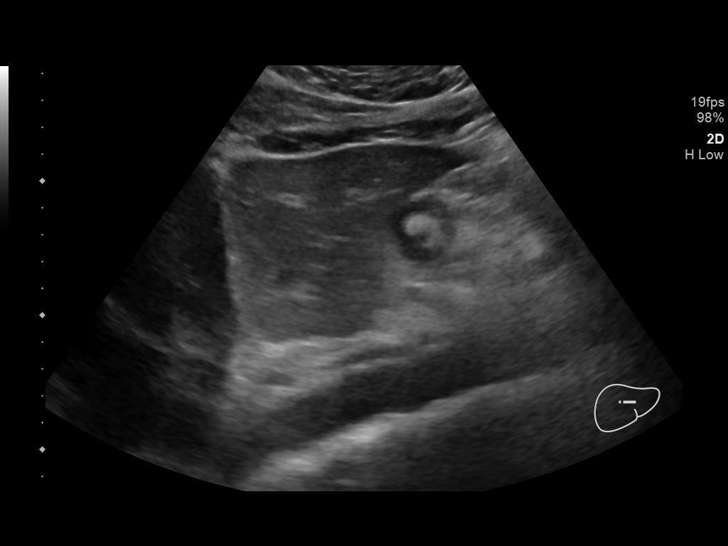
[im 94/150]
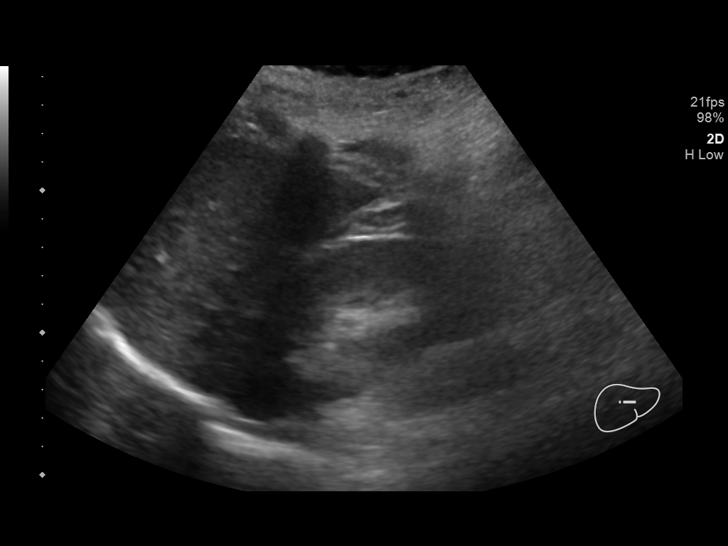
[im 100/150]
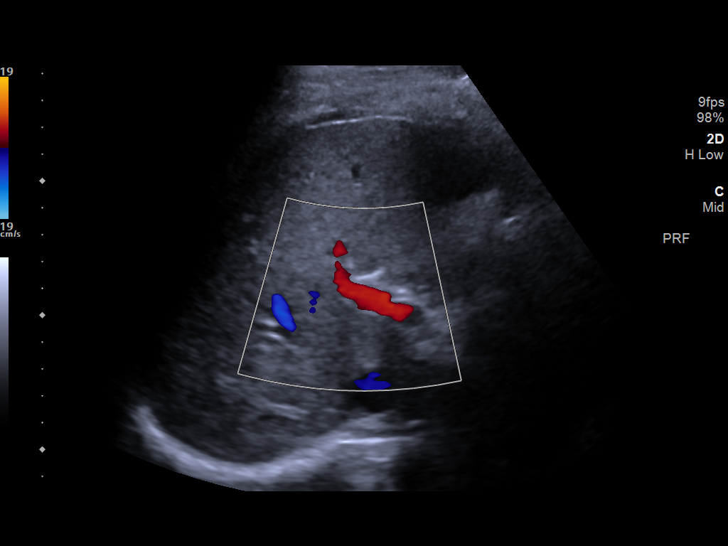
[im 112/150]
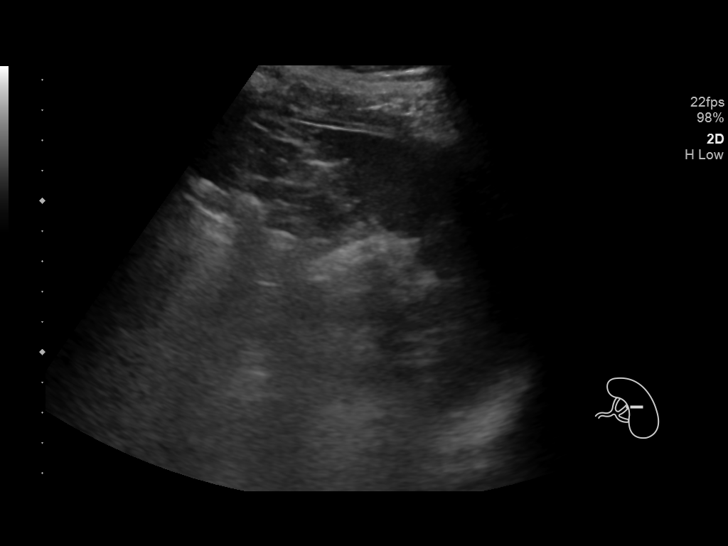
[im 125/150]
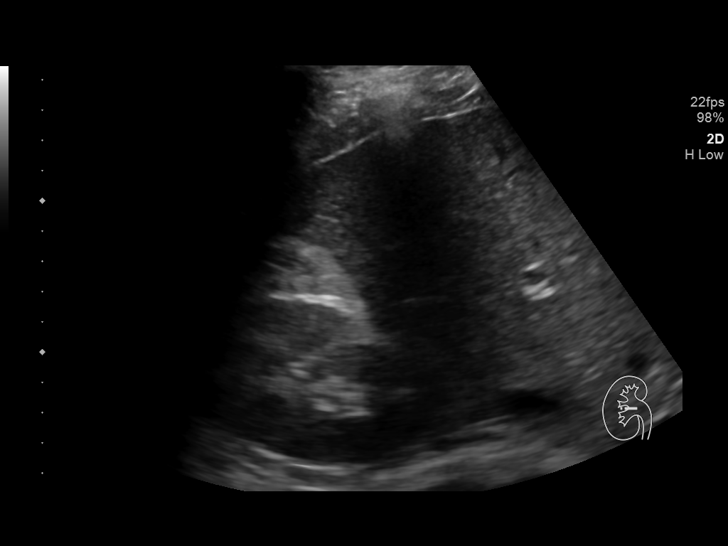
[im 137/150]
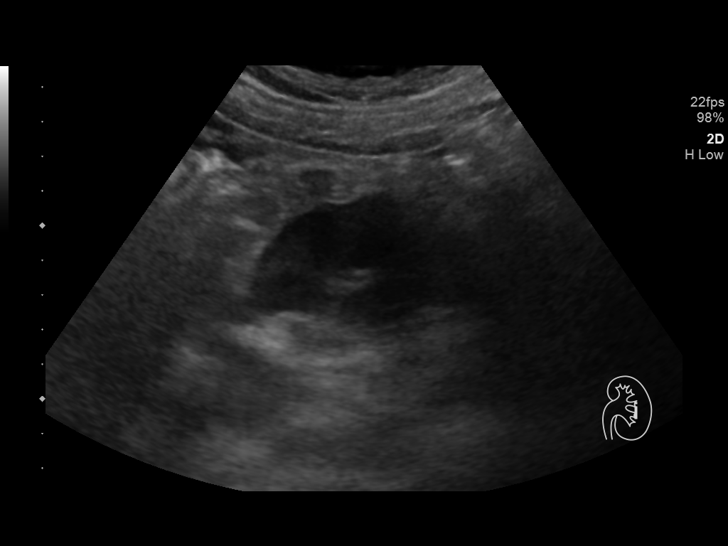
[im 150/150]
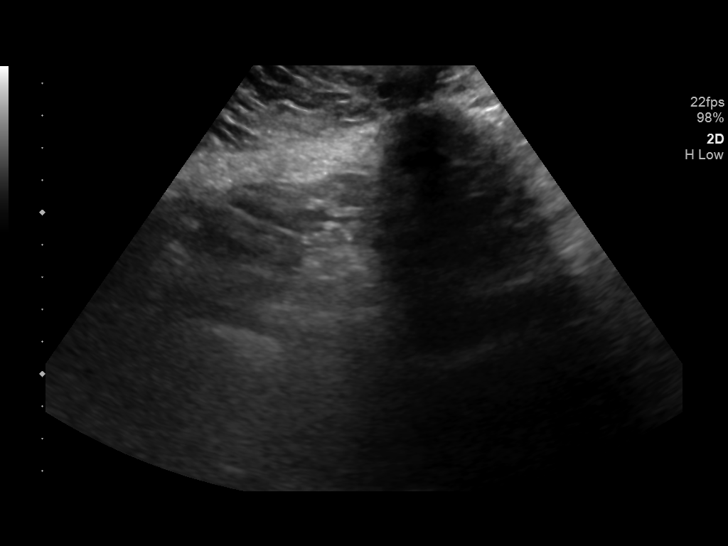

[14 of 25 positions shown; findings below may reference images not displayed]

FINDINGS: Gallbladder: No gallstones or wall thickening visualized. No
sonographic Murphy sign noted by sonographer.

Common bile duct: Diameter: 4 mm

Liver: No focal lesion identified. Within normal limits in
parenchymal echogenicity. Portal vein is patent on color Doppler
imaging with normal direction of blood flow towards the liver.

IVC: No abnormality visualized.

Pancreas: Visualized portion unremarkable.

Spleen: Size and appearance within normal limits.

Right Kidney: Length: 10.5 cm. Echogenicity within normal limits. No
mass or hydronephrosis visualized.

Left Kidney: Length: 9.5 Cm, but underestimated due to obliquity of
imaging. Echogenicity within normal limits. No mass or
hydronephrosis visualized.

Abdominal aorta: No aneurysm visualized.

Other findings: None.
IMPRESSION: Negative abdominal ultrasound.

## 2019-12-02 ENCOUNTER — Emergency Department (HOSPITAL_BASED_OUTPATIENT_CLINIC_OR_DEPARTMENT_OTHER): Payer: Self-pay

## 2019-12-02 ENCOUNTER — Other Ambulatory Visit: Payer: Self-pay

## 2019-12-02 ENCOUNTER — Encounter (HOSPITAL_BASED_OUTPATIENT_CLINIC_OR_DEPARTMENT_OTHER): Payer: Self-pay | Admitting: *Deleted

## 2019-12-02 ENCOUNTER — Emergency Department (HOSPITAL_BASED_OUTPATIENT_CLINIC_OR_DEPARTMENT_OTHER)
Admission: EM | Admit: 2019-12-02 | Discharge: 2019-12-02 | Disposition: A | Discharge status: Home / Self Care | Payer: Self-pay | Attending: Emergency Medicine | Admitting: Emergency Medicine

## 2019-12-02 DIAGNOSIS — S0990XA Unspecified injury of head, initial encounter: Secondary | ICD-10-CM

## 2019-12-02 DIAGNOSIS — R42 Dizziness and giddiness: Secondary | ICD-10-CM | POA: Insufficient documentation

## 2019-12-02 DIAGNOSIS — I10 Essential (primary) hypertension: Secondary | ICD-10-CM | POA: Insufficient documentation

## 2019-12-02 DIAGNOSIS — F17210 Nicotine dependence, cigarettes, uncomplicated: Secondary | ICD-10-CM | POA: Insufficient documentation

## 2019-12-02 DIAGNOSIS — R519 Headache, unspecified: Secondary | ICD-10-CM | POA: Insufficient documentation

## 2019-12-02 MED ORDER — AMLODIPINE BESYLATE 10 MG PO TABS: 10.0000 mg | ORAL_TABLET | Freq: Every day | ORAL | 0 refills | Status: AC
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
## 2019-12-02 NOTE — Discharge Instructions (Addendum)
Your CT scan is normal.  Take your blood pressure medication as prescribed.  Follow-up with your doctor.  Return to the ED with worsening headache, visual changes, vomiting, any neurological deficits or any other concerns

## 2020-03-20 ENCOUNTER — Ambulatory Visit: Payer: Self-pay | Attending: Internal Medicine

## 2020-03-20 DIAGNOSIS — Z20822 Contact with and (suspected) exposure to covid-19: Secondary | ICD-10-CM

## 2020-03-21 LAB — SPECIMEN STATUS REPORT

## 2020-03-21 LAB — SARS-COV-2, NAA 2 DAY TAT

## 2020-03-21 LAB — NOVEL CORONAVIRUS, NAA: SARS-CoV-2, NAA: NOT DETECTED

## 2022-02-02 ENCOUNTER — Emergency Department (HOSPITAL_BASED_OUTPATIENT_CLINIC_OR_DEPARTMENT_OTHER): Payer: 59

## 2022-02-02 ENCOUNTER — Encounter (HOSPITAL_BASED_OUTPATIENT_CLINIC_OR_DEPARTMENT_OTHER): Payer: Self-pay | Admitting: *Deleted

## 2022-02-02 ENCOUNTER — Emergency Department (HOSPITAL_BASED_OUTPATIENT_CLINIC_OR_DEPARTMENT_OTHER)
Admission: EM | Admit: 2022-02-02 | Discharge: 2022-02-03 | Disposition: A | Discharge status: Home / Self Care | Payer: 59 | Attending: Emergency Medicine | Admitting: Emergency Medicine

## 2022-02-02 ENCOUNTER — Other Ambulatory Visit: Payer: Self-pay

## 2022-02-02 DIAGNOSIS — R519 Headache, unspecified: Secondary | ICD-10-CM | POA: Insufficient documentation

## 2022-02-02 DIAGNOSIS — M542 Cervicalgia: Secondary | ICD-10-CM | POA: Diagnosis not present

## 2022-02-02 DIAGNOSIS — M545 Low back pain, unspecified: Secondary | ICD-10-CM | POA: Diagnosis not present

## 2022-02-02 DIAGNOSIS — Y92410 Unspecified street and highway as the place of occurrence of the external cause: Secondary | ICD-10-CM | POA: Diagnosis not present

## 2022-02-02 MED ORDER — ACETAMINOPHEN 325 MG PO TABS
650.0000 mg | ORAL_TABLET | Freq: Once | ORAL | Status: AC
  Administered 2022-02-03: 650 mg via ORAL
  Filled 2022-02-02: qty 2
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
## 2022-02-03 MED ORDER — METHOCARBAMOL 500 MG PO TABS: 500.0000 mg | ORAL_TABLET | Freq: Two times a day (BID) | ORAL | 0 refills | Status: AC
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
## 2022-08-09 ENCOUNTER — Other Ambulatory Visit: Payer: Self-pay

## 2022-08-09 ENCOUNTER — Emergency Department (HOSPITAL_BASED_OUTPATIENT_CLINIC_OR_DEPARTMENT_OTHER): Payer: No Typology Code available for payment source

## 2022-08-09 ENCOUNTER — Encounter (HOSPITAL_BASED_OUTPATIENT_CLINIC_OR_DEPARTMENT_OTHER): Payer: Self-pay

## 2022-08-09 ENCOUNTER — Emergency Department (HOSPITAL_BASED_OUTPATIENT_CLINIC_OR_DEPARTMENT_OTHER)
Admission: EM | Admit: 2022-08-09 | Discharge: 2022-08-09 | Disposition: A | Discharge status: Home / Self Care | Payer: No Typology Code available for payment source | Attending: Emergency Medicine | Admitting: Emergency Medicine

## 2022-08-09 DIAGNOSIS — Y92410 Unspecified street and highway as the place of occurrence of the external cause: Secondary | ICD-10-CM | POA: Insufficient documentation

## 2022-08-09 DIAGNOSIS — R0789 Other chest pain: Secondary | ICD-10-CM | POA: Diagnosis not present

## 2022-08-09 DIAGNOSIS — R519 Headache, unspecified: Secondary | ICD-10-CM | POA: Diagnosis present

## 2022-08-09 DIAGNOSIS — M542 Cervicalgia: Secondary | ICD-10-CM | POA: Diagnosis not present

## 2022-08-09 DIAGNOSIS — Z79899 Other long term (current) drug therapy: Secondary | ICD-10-CM | POA: Insufficient documentation

## 2022-08-09 MED ORDER — CYCLOBENZAPRINE HCL 5 MG PO TABS: 5.0000 mg | ORAL_TABLET | Freq: Three times a day (TID) | ORAL | 0 refills | Status: AC | PRN

## 2022-08-09 MED ORDER — CYCLOBENZAPRINE HCL 5 MG PO TABS
5.0000 mg | ORAL_TABLET | Freq: Once | ORAL | Status: AC
  Administered 2022-08-09: 5 mg via ORAL
  Filled 2022-08-09: qty 1

## 2022-08-09 MED ORDER — ACETAMINOPHEN 325 MG PO TABS
650.0000 mg | ORAL_TABLET | Freq: Once | ORAL | Status: AC
  Administered 2022-08-09: 650 mg via ORAL
  Filled 2022-08-09: qty 2
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
## 2022-08-09 NOTE — Discharge Instructions (Signed)
Take motrin for pain   Take flexeril for muscle spasms   Expect to be stiff and sore   See your doctor   Return to ER if you have worse neck pain, headache, vomiting
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
